# Patient Record
Sex: Male | Born: 1960 | Race: White | Hispanic: No | Marital: Married | Smoking: Never smoker
Health system: Southern US, Community
[De-identification: ages and names within clinical notes are randomized; demographics above are authoritative.]

## PROBLEM LIST (undated history)

## (undated) HISTORY — PX: CHOLECYSTECTOMY: SHX55

## (undated) HISTORY — PX: ABDOMINAL SURGERY: SHX537

---

## 1998-01-17 ENCOUNTER — Inpatient Hospital Stay (HOSPITAL_COMMUNITY): Admission: EM | Admit: 1998-01-17 | Discharge: 1998-01-22 | Payer: Self-pay | Admitting: Emergency Medicine

## 2001-05-25 ENCOUNTER — Emergency Department (HOSPITAL_COMMUNITY): Admission: EM | Admit: 2001-05-25 | Discharge: 2001-05-25 | Payer: Self-pay | Admitting: Emergency Medicine

## 2016-03-29 ENCOUNTER — Emergency Department (HOSPITAL_COMMUNITY)
Admission: EM | Admit: 2016-03-29 | Discharge: 2016-03-29 | Disposition: A | Discharge status: Home / Self Care | Payer: Commercial Managed Care - PPO | Attending: Emergency Medicine | Admitting: Emergency Medicine

## 2016-03-29 ENCOUNTER — Encounter (HOSPITAL_COMMUNITY): Payer: Self-pay | Admitting: Emergency Medicine

## 2016-03-29 DIAGNOSIS — IMO0002 Reserved for concepts with insufficient information to code with codable children: Secondary | ICD-10-CM

## 2016-03-29 DIAGNOSIS — Z5181 Encounter for therapeutic drug level monitoring: Secondary | ICD-10-CM | POA: Insufficient documentation

## 2016-03-29 DIAGNOSIS — F10988 Alcohol use, unspecified with other alcohol-induced disorder: Secondary | ICD-10-CM | POA: Insufficient documentation

## 2016-03-29 LAB — CBC
HEMATOCRIT: 42.8 % (ref 39.0–52.0)
HEMOGLOBIN: 15 g/dL (ref 13.0–17.0)
MCH: 32.8 pg (ref 26.0–34.0)
MCHC: 35 g/dL (ref 30.0–36.0)
MCV: 93.4 fL (ref 78.0–100.0)
PLATELETS: 106 10*3/uL — AB (ref 150–400)
RBC: 4.58 MIL/uL (ref 4.22–5.81)
RDW: 11.4 % — ABNORMAL LOW (ref 11.5–15.5)
WBC: 3.9 10*3/uL — AB (ref 4.0–10.5)

## 2016-03-29 LAB — COMPREHENSIVE METABOLIC PANEL
ALBUMIN: 4.2 g/dL (ref 3.5–5.0)
ALT: 43 U/L (ref 17–63)
ANION GAP: 6 (ref 5–15)
AST: 62 U/L — ABNORMAL HIGH (ref 15–41)
Alkaline Phosphatase: 74 U/L (ref 38–126)
BUN: <5 mg/dL — ABNORMAL LOW (ref 6–20)
CHLORIDE: 104 mmol/L (ref 101–111)
CO2: 25 mmol/L (ref 22–32)
Calcium: 9.3 mg/dL (ref 8.9–10.3)
Creatinine, Ser: 0.78 mg/dL (ref 0.61–1.24)
GFR calc non Af Amer: >60 mL/min (ref >60)
GLUCOSE: 109 mg/dL — AB (ref 65–99)
POTASSIUM: 3.4 mmol/L — AB (ref 3.5–5.1)
SODIUM: 135 mmol/L (ref 135–145)
Total Bilirubin: 2.4 mg/dL — ABNORMAL HIGH (ref 0.3–1.2)
Total Protein: 7.3 g/dL (ref 6.5–8.1)

## 2016-03-29 LAB — RAPID URINE DRUG SCREEN, HOSP PERFORMED
AMPHETAMINES: NOT DETECTED
BARBITURATES: NOT DETECTED
BENZODIAZEPINES: NOT DETECTED
Cocaine: NOT DETECTED
Opiates: NOT DETECTED
TETRAHYDROCANNABINOL: NOT DETECTED

## 2016-03-29 LAB — ETHANOL: Alcohol, Ethyl (B): <5 mg/dL (ref <5)

## 2016-03-29 NOTE — ED Provider Notes (Signed)
[facility]-EMERGENCY DEPT Provider Note   CSN: [id number] Arrival date & time: 03/29/16  1859  First Provider Contact:  First MD Initiated Contact with Patient 03/29/16 2111        History   Chief Complaint Chief Complaint  Patient presents with  . Alcohol Problem    HPI Jeff Dixon is a 55 y.o. male.  55 year old male with a history of alcohol use disorder presents to the ED for alcohol detox. He was sent here after presenting to [facility], per wife. Patient reports drinking 1 pint of liquor daily. Last drink was last night. He denies any tremors currently. No hx of seizures from ETOH withdrawal. He states that he has been drinking heavily "forever". He denies SI/HI. Family with patient, at bedside, stating that they brought him here because they didn't know how to proceed with the process of his sobriety.       History reviewed. No pertinent past medical history.  There are no active problems to display for this patient.   Past Surgical History:  Procedure Laterality Date  . ABDOMINAL SURGERY    . CHOLECYSTECTOMY         Home Medications    Prior to Admission medications   Not on File    Family History No family history on file.  Social History Social History  Substance Use Topics  . Smoking status: Never Smoker  . Smokeless tobacco: Not on file  . Alcohol use Yes     Allergies   Review of patient's allergies indicates no known allergies.   Review of Systems Review of Systems Ten systems reviewed and are negative for acute change, except as noted in the HPI.    Physical Exam Updated Vital Signs BP 118/86 (BP Location: Right Arm)   Pulse (!) 56   Temp 99.2 F (37.3 C) (Oral)   Resp 16   Ht  (1.753 m)   Wt 79.4 kg   SpO2 98%   BMI 25.84 kg/m   Physical Exam  Constitutional: He is oriented to person, place, and time. He appears well-developed and well-nourished. No distress.  Patient in NAD. Calm and cooperative.  HENT:  Head:  Normocephalic and atraumatic.  Eyes: Conjunctivae and EOM are normal. No scleral icterus.  Neck: Normal range of motion.  Cardiovascular: Normal rate and regular rhythm.   Pulmonary/Chest: Effort normal. No respiratory distress.  Respirations even and unlabored  Musculoskeletal: Normal range of motion.  Neurological: He is alert and oriented to person, place, and time.  GCS 15. Speech is goal oriented.  Skin: Skin is warm and dry. No rash noted. He is not diaphoretic. No erythema. No pallor.  Psychiatric: He has a normal mood and affect. His behavior is normal.  Nursing note and vitals reviewed.    ED Treatments / Results  Labs (all labs ordered are listed, but only abnormal results are displayed) Labs Reviewed  COMPREHENSIVE METABOLIC PANEL - Abnormal; Notable for the following:       Result Value   Potassium 3.4 (*)    Glucose, Bld 109 (*)    BUN <5 (*)    AST 62 (*)    Total Bilirubin 2.4 (*)    All other components within normal limits  CBC - Abnormal; Notable for the following:    WBC 3.9 (*)    RDW 11.4 (*)    Platelets 106 (*)    All other components within normal limits  ETHANOL  URINE RAPID DRUG SCREEN, HOSP PERFORMED  EKG  EKG Interpretation None       Radiology No results found.  Procedures Procedures (including critical care time)  Medications Ordered in ED Medications - No data to display   Initial Impression / Assessment and Plan / ED Course  I have reviewed the triage vital signs and the nursing notes.  Pertinent labs & imaging results that were available during my care of the patient were reviewed by me and considered in my medical decision making (see chart for details).  Clinical Course    55 year old male presents to the emergency department requesting a call detox. His last drink was last night and his alcohol level is undetectable. Patient was stable vital signs; no tachycardia or hypertension. No history of seizures from alcohol  withdrawal. It appears that patient has been able to withdraw from alcohol without any significant complications prior to his presentation this evening.  No c/o SI/HI. No indication for TTS consult. I have had a long discussion with the patient and his family at bedside regarding outpatient residential or daily programs for alcohol rehabilitation. Will provide resource guide for this. Return precautions discussed and provided. Patient and family agreeable to plan with no unaddressed concerns.   Final Clinical Impressions(s) / ED Diagnoses   Final diagnoses:  Alcohol use disorder [facility])    New Prescriptions New Prescriptions   No medications on file     Antony Madura, PA-C 03/29/16 2148    Arby Barrette, MD 03/30/16 2308

## 2016-03-29 NOTE — ED Triage Notes (Signed)
Patient requesting detox for his alcoholism , last drink last night , denies  suicidal ideation , no hallucinations .

## 2016-03-29 NOTE — ED Notes (Signed)
EDP at bedside  

## 2017-04-29 ENCOUNTER — Inpatient Hospital Stay (HOSPITAL_COMMUNITY)
Admission: AD | Admit: 2017-04-29 | Discharge: 2017-05-05 | DRG: 885 | Disposition: A | Discharge status: Home / Self Care | Payer: Federal, State, Local not specified - Other | Source: Intra-hospital | Attending: Psychiatry | Admitting: Psychiatry

## 2017-04-29 ENCOUNTER — Encounter (HOSPITAL_COMMUNITY): Payer: Self-pay

## 2017-04-29 DIAGNOSIS — F323 Major depressive disorder, single episode, severe with psychotic features: Secondary | ICD-10-CM | POA: Diagnosis present

## 2017-04-29 DIAGNOSIS — X749XXA Intentional self-harm by unspecified firearm discharge, initial encounter: Secondary | ICD-10-CM | POA: Diagnosis not present

## 2017-04-29 DIAGNOSIS — R45851 Suicidal ideations: Secondary | ICD-10-CM | POA: Diagnosis present

## 2017-04-29 DIAGNOSIS — Z833 Family history of diabetes mellitus: Secondary | ICD-10-CM

## 2017-04-29 DIAGNOSIS — Z634 Disappearance and death of family member: Secondary | ICD-10-CM | POA: Diagnosis not present

## 2017-04-29 DIAGNOSIS — F1010 Alcohol abuse, uncomplicated: Secondary | ICD-10-CM | POA: Diagnosis present

## 2017-04-29 DIAGNOSIS — Z9049 Acquired absence of other specified parts of digestive tract: Secondary | ICD-10-CM | POA: Diagnosis not present

## 2017-04-29 DIAGNOSIS — G4700 Insomnia, unspecified: Secondary | ICD-10-CM | POA: Diagnosis not present

## 2017-04-29 DIAGNOSIS — F1910 Other psychoactive substance abuse, uncomplicated: Secondary | ICD-10-CM | POA: Diagnosis not present

## 2017-04-29 DIAGNOSIS — T1491XA Suicide attempt, initial encounter: Secondary | ICD-10-CM | POA: Diagnosis not present

## 2017-04-29 MED ORDER — HYDROXYZINE HCL 25 MG PO TABS
25.0000 mg | ORAL_TABLET | Freq: Three times a day (TID) | ORAL | Status: DC | PRN
  Filled 2017-04-29 (×5): qty 10

## 2017-04-29 MED ORDER — ALUM & MAG HYDROXIDE-SIMETH 200-200-20 MG/5ML PO SUSP: 30.0000 mL | ORAL | Status: DC | PRN

## 2017-04-29 MED ORDER — ACETAMINOPHEN 325 MG PO TABS
650.0000 mg | ORAL_TABLET | Freq: Four times a day (QID) | ORAL | Status: DC | PRN
Start: 2017-04-29 — End: 2017-05-05

## 2017-04-29 MED ORDER — TRAZODONE HCL 50 MG PO TABS
50.0000 mg | ORAL_TABLET | Freq: Every evening | ORAL | Status: DC | PRN
  Filled 2017-04-29: qty 1

## 2017-04-29 MED ORDER — MAGNESIUM HYDROXIDE 400 MG/5ML PO SUSP: 30.0000 mL | Freq: Every day | ORAL | Status: DC | PRN

## 2017-04-29 NOTE — Progress Notes (Signed)
Writer spoke with patient shortly after report. He and his wife were at the nursing station talking with his nurse. His wife was very concerned about her husband being ok and became tearful asking to be called if anything came about. Writer spoke with charge nurse about patient not being appropriate for the hall and his wife's concern. Patient was moved to 400 hall until bed on 300 becomes available. Patient was oriented to the hall and informed of AA meeting taking place which he attended. He was informed of medications available but declined. Support given and safety maintained on unit with 15 min checks.
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
## 2017-04-29 NOTE — Tx Team (Signed)
Initial Treatment Plan 04/29/2017 8:08 PM Jeff Dixon Jeff Dixon LPF:[id number]    PATIENT STRESSORS: Substance abuse   PATIENT STRENGTHS: Advertising account executive for treatment/growth Physical Health Supportive family/friends   PATIENT IDENTIFIED PROBLEMS: Depression  Suicidal ideation  Substance abuse  "I need to work on my alcohol abuse"               DISCHARGE CRITERIA:  Improved stabilization in mood, thinking, and/or behavior Verbal commitment to aftercare and medication compliance Withdrawal symptoms are absent or subacute and managed without 24-hour nursing intervention  PRELIMINARY DISCHARGE PLAN: Outpatient therapy Medication management  PATIENT/FAMILY INVOLVEMENT: This treatment plan has been presented to and reviewed with the patient, Jeff Dixon.  The patient and family have been given the opportunity to ask questions and make suggestions.  Levin Bacon, RN 04/29/2017, 8:08 PM

## 2017-04-30 ENCOUNTER — Encounter (HOSPITAL_COMMUNITY): Payer: Self-pay | Admitting: Psychiatry

## 2017-04-30 DIAGNOSIS — G4700 Insomnia, unspecified: Secondary | ICD-10-CM

## 2017-04-30 DIAGNOSIS — Z634 Disappearance and death of family member: Secondary | ICD-10-CM

## 2017-04-30 DIAGNOSIS — R45851 Suicidal ideations: Secondary | ICD-10-CM

## 2017-04-30 DIAGNOSIS — F1910 Other psychoactive substance abuse, uncomplicated: Secondary | ICD-10-CM

## 2017-04-30 DIAGNOSIS — F323 Major depressive disorder, single episode, severe with psychotic features: Principal | ICD-10-CM

## 2017-04-30 MED ORDER — LORAZEPAM 1 MG PO TABS: 1.0000 mg | ORAL_TABLET | Freq: Four times a day (QID) | ORAL | Status: AC | PRN

## 2017-04-30 MED ORDER — NALTREXONE HCL 50 MG PO TABS
25.0000 mg | ORAL_TABLET | Freq: Every day | ORAL | Status: DC
  Filled 2017-04-30 (×2): qty 1

## 2017-04-30 MED ORDER — LOPERAMIDE HCL 2 MG PO CAPS: 2.0000 mg | ORAL_CAPSULE | ORAL | Status: AC | PRN

## 2017-04-30 MED ORDER — ONDANSETRON 4 MG PO TBDP: 4.0000 mg | ORAL_TABLET | Freq: Four times a day (QID) | ORAL | Status: AC | PRN

## 2017-04-30 MED ORDER — VITAMIN B-1 100 MG PO TABS
100.0000 mg | ORAL_TABLET | Freq: Every day | ORAL | Status: DC
  Administered 2017-05-01 – 2017-05-05 (×5): 100 mg via ORAL
  Filled 2017-04-30 (×7): qty 1

## 2017-04-30 MED ORDER — ESCITALOPRAM OXALATE 5 MG PO TABS
5.0000 mg | ORAL_TABLET | Freq: Every day | ORAL | Status: DC
  Filled 2017-04-30 (×2): qty 1

## 2017-04-30 MED ORDER — THIAMINE HCL 100 MG/ML IJ SOLN: 100.0000 mg | Freq: Once | INTRAMUSCULAR | Status: DC

## 2017-04-30 MED ORDER — ADULT MULTIVITAMIN W/MINERALS CH
1.0000 | ORAL_TABLET | Freq: Every day | ORAL | Status: DC
  Administered 2017-04-30 – 2017-05-05 (×6): 1 via ORAL
  Filled 2017-04-30 (×9): qty 1

## 2017-04-30 NOTE — Progress Notes (Signed)
Patient attended the evening group session and responded to all discussion prompts from this Clinical research associate. Patient shared his goal for the day was to discharge. Patients affect was appropriate and rated his day an 8 out of 10.
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
## 2017-04-30 NOTE — Progress Notes (Signed)
Nursing Progress Note [id number]  D) Patient presents with flat affect and is cautious/minimal with Clinical research associate. Patient reports, "I am trying to remember my wife's phone number. I lost my paper". Writer informed patient Clinical research associate could check for wife's number in his chart. Patient denied need and states, "I think I know it". Patient forwards little with Clinical research associate and appears preoccupied. Patient denied concerns for writer and denied need for sleep medication. Patient denies SI/HI/AVH or pain and contracts for safety on the unit. Patient does not appear to be in withdrawal at this time, CIWA score is 0.  A) Emotional support given. 1:1 interaction and active listening provided. No medications requested/scheduled this shift. Snacks and fluids provided. Opportunities for questions or concerns presented to patient. Patient encouraged to continue to work on treatment goals. Labs, vital signs and patient behavior monitored throughout shift. Patient safety maintained with q15 min safety checks. Low fall risk precautions in place and reviewed with patient; patient verbalized understanding.  R) Patient receptive to interaction with nurse. Patient remains safe on the unit at this time. Patient is resting in bed without complaints. Will continue to monitor.

## 2017-04-30 NOTE — [facility] Group Notes (Signed)
[facility] Group Notes:  (Nursing/MHT/Case Management/Adjunct)  Date:  04/30/2017  Time:  1:16 PM  Type of Therapy:  Nurse Education  Participation Level:  Active  Participation Quality:  Attentive  Affect:  Appropriate  Cognitive:  Alert  Insight:  Good  Engagement in Group:  Engaged   Modes of Intervention:  Education  Summary of Progress/Problems:  Jeff Dixon 04/30/2017, 1:16 PM

## 2017-04-30 NOTE — Plan of Care (Signed)
Problem: Safety: Goal: Periods of time without injury will increase Outcome: Progressing Patient is on q15 minute safety checks and low fall risk precautions. Patient contracts for safety on the unit and remains safe at this time.   

## 2017-04-30 NOTE — [facility] Group Notes (Deleted)
[facility] LCSW Group Therapy Note  Date/Time 04/30/17 10:00AM  Type of Therapy and Topic:  Group Therapy:  Cognitive Distortions  Participation Level:  Did Not Attend   Description of Group:    Patients in this group will be introduced to the topic of cognitive distortions.  Patients will identify and describe cognitive distortions, describe the feelings these distortions create for them.  Patients will identify one or more situations in their personal life where they have cognitively distorted thinking and will verbalize challenging this cognitive distortion through positive thinking skills.  Patients will practice the skill of using positive affirmations to challenge cognitive distortions.    Yared Barefoot J Nelsie Domino MSW, LCSW  

## 2017-04-30 NOTE — [facility] Group Notes (Addendum)
Healthy Support Systems   Date:  04/30/2017  Time:  1100  Type of Therapy:  Nurse Education   /  Healthy Support Systems: The group is focused on teaching patients how to develop and maintain healthy support systems.  Participation Quality:  Attentive  Affect:  Flat  Cognitive:  Oriented  Insight:  Limited  Engagement in Group:  Improving  Modes of Intervention:  Education  Summary of Progress/Problems:  Rich Brave 04/30/2017, 1:18 PM

## 2017-04-30 NOTE — [facility] Group Notes (Signed)
[facility] LCSW Group Therapy Note   04/30/2017  10:15 - 11:15 AM   Type of Therapy and Topic: Group Therapy: Feelings Around Returning Home & Establishing a Framework Self Care   Participation Level:  Minimal   Description of Group:  Patients first processed thoughts and feelings about up coming discharge. These included fears of upcoming changes, lack of change, new living environments, judgements and expectations from others and overall stigma of MH issues. We discussed where to find supports and what self care may look and feel like.  Therapeutic Goals Addressed in Processing Group:  1. Patient will identify one healthy supportive that they can use at discharge. 2. Patient will identify one factor of a supportive framework and how to tell it from an unhealthy network. 3. Patient able to identify one coping skill to use when they do not have positive supports from others. 4. Patient will demonstrate ability to communicate their needs through discussion and/or role plays.  Summary of Patient Progress:  Pt engaged minimally during group session. As patients processed their anxiety about discharge and described healthy supports patient was quietly observant. As this is patient's first group session it is expected he will engage more as stay progresses.   Carney Bern, LCSW

## 2017-04-30 NOTE — Progress Notes (Signed)
Data. Patient denies SI/HI/AVH. Verbally contracts for safety on the unit and to come to staff before acting of any self harm thoughts/feelings.  Patient interacting minimally with staff and other patients. Patient's affect is flat/blunt and does not brighten with interaction. Action. Emotional support and encouragement offered. Education provided on medication, indications and side effect. Q 15 minute checks done for safety. Response. Safety on the unit maintained through 15 minute checks.  Medications taken as prescribed. Attended groups. Remained calm and appropriate through out shift.

## 2017-05-01 DIAGNOSIS — F419 Anxiety disorder, unspecified: Secondary | ICD-10-CM

## 2017-05-01 DIAGNOSIS — T1491XA Suicide attempt, initial encounter: Secondary | ICD-10-CM

## 2017-05-01 DIAGNOSIS — X749XXA Intentional self-harm by unspecified firearm discharge, initial encounter: Secondary | ICD-10-CM

## 2017-05-01 LAB — TSH: TSH: 9.614 u[IU]/mL — ABNORMAL HIGH (ref 0.350–4.500)

## 2017-05-01 LAB — BASIC METABOLIC PANEL
ANION GAP: 7 (ref 5–15)
BUN: 11 mg/dL (ref 6–20)
CHLORIDE: 104 mmol/L (ref 101–111)
CO2: 28 mmol/L (ref 22–32)
Calcium: 10.1 mg/dL (ref 8.9–10.3)
Creatinine, Ser: 0.8 mg/dL (ref 0.61–1.24)
GFR calc Af Amer: >60 mL/min (ref >60)
Glucose, Bld: 85 mg/dL (ref 65–99)
POTASSIUM: 3.9 mmol/L (ref 3.5–5.1)
SODIUM: 139 mmol/L (ref 135–145)

## 2017-05-01 LAB — MAGNESIUM: MAGNESIUM: 1.6 mg/dL — AB (ref 1.7–2.4)

## 2017-05-01 LAB — VITAMIN B12: VITAMIN B 12: 688 pg/mL (ref 180–914)

## 2017-05-01 NOTE — Tx Team (Signed)
Interdisciplinary Treatment and Diagnostic Plan Update  05/01/2017 Time of Session: 9:30am Jeff Dixon MRN: [id number]  Principal Diagnosis: MDD (major depressive disorder), single episode, severe with psychosis ([place])  Secondary Diagnoses: Principal Problem:   MDD (major depressive disorder), single episode, severe with psychosis ([place])   Current Medications:  Current Facility-Administered Medications  Medication Dose Route Frequency Provider Last Rate Last Dose  . acetaminophen (TYLENOL) tablet 650 mg  650 mg Oral Q6H PRN Okonkwo, Justina A, NP      . alum & mag hydroxide-simeth (MAALOX/MYLANTA) 200-200-20 MG/5ML suspension 30 mL  30 mL Oral Q4H PRN Okonkwo, Justina A, NP      . hydrOXYzine (ATARAX/VISTARIL) tablet 25 mg  25 mg Oral TID PRN Okonkwo, Justina A, NP      . loperamide (IMODIUM) capsule 2-4 mg  2-4 mg Oral PRN Derrill Center, NP      . LORazepam (ATIVAN) tablet 1 mg  1 mg Oral Q6H PRN Derrill Center, NP      . magnesium hydroxide (MILK OF MAGNESIA) suspension 30 mL  30 mL Oral Daily PRN Okonkwo, Justina A, NP      . multivitamin with minerals tablet 1 tablet  1 tablet Oral Daily Derrill Center, NP   1 tablet at 05/01/17 [phone]  . ondansetron (ZOFRAN-ODT) disintegrating tablet 4 mg  4 mg Oral Q6H PRN Derrill Center, NP      . thiamine (B-1) injection 100 mg  100 mg Intramuscular Once Derrill Center, NP      . thiamine (VITAMIN B-1) tablet 100 mg  100 mg Oral Daily Derrill Center, NP   100 mg at 05/01/17 0918  . traZODone (DESYREL) tablet 50 mg  50 mg Oral QHS PRN Okonkwo, Justina A, NP        PTA Medications: No prescriptions prior to admission.    Treatment Modalities: Medication Management, Group therapy, Case management,  1 to 1 session with clinician, Psychoeducation, Recreational therapy.  Patient Stressors: Substance abuse  Patient Strengths: Dispensing optician for treatment/growth Physical Health Supportive  family/friends  Physician Treatment Plan for Primary Diagnosis: MDD (major depressive disorder), single episode, severe with psychosis ([place]) Long Term Goal(s): Improvement in symptoms so as ready for discharge  Short Term Goals: Ability to identify changes in lifestyle to reduce recurrence of condition will improve Ability to verbalize feelings will improve Ability to demonstrate self-control will improve Ability to demonstrate self-control will improve Compliance with prescribed medications will improve Ability to identify triggers associated with substance abuse/mental health issues will improve  Medication Management: Evaluate patient's response, side effects, and tolerance of medication regimen.  Therapeutic Interventions: 1 to 1 sessions, Unit Group sessions and Medication administration.  Evaluation of Outcomes: Not Met  Physician Treatment Plan for Secondary Diagnosis: Principal Problem:   MDD (major depressive disorder), single episode, severe with psychosis ([place])   Long Term Goal(s): Improvement in symptoms so as ready for discharge  Short Term Goals: Ability to identify changes in lifestyle to reduce recurrence of condition will improve Ability to verbalize feelings will improve Ability to demonstrate self-control will improve Ability to demonstrate self-control will improve Compliance with prescribed medications will improve Ability to identify triggers associated with substance abuse/mental health issues will improve  Medication Management: Evaluate patient's response, side effects, and tolerance of medication regimen.  Therapeutic Interventions: 1 to 1 sessions, Unit Group sessions and Medication administration.  Evaluation of Outcomes: Not Met   RN Treatment Plan for Primary Diagnosis:  MDD (major depressive disorder), single episode, severe with psychosis ([place]) Long Term Goal(s): Knowledge of disease and therapeutic regimen to maintain health will improve  Short  Term Goals: Ability to participate in decision making will improve, Ability to identify and develop effective coping behaviors will improve and Compliance with prescribed medications will improve  Medication Management: RN will administer medications as ordered by provider, will assess and evaluate patient's response and provide education to patient for prescribed medication. RN will report any adverse and/or side effects to prescribing provider.  Therapeutic Interventions: 1 on 1 counseling sessions, Psychoeducation, Medication administration, Evaluate responses to treatment, Monitor vital signs and CBGs as ordered, Perform/monitor CIWA, COWS, AIMS and Fall Risk screenings as ordered, Perform wound care treatments as ordered.  Evaluation of Outcomes: Not Met   LCSW Treatment Plan for Primary Diagnosis: MDD (major depressive disorder), single episode, severe with psychosis ([place]) Long Term Goal(s): Safe transition to appropriate next level of care at discharge, Engage patient in therapeutic group addressing interpersonal concerns.  Short Term Goals: Engage patient in aftercare planning with referrals and resources, Facilitate acceptance of mental health diagnosis and concerns and Increase skills for wellness and recovery  Therapeutic Interventions: Assess for all discharge needs, 1 to 1 time with Social worker, Explore available resources and support systems, Assess for adequacy in community support network, Educate family and significant other(s) on suicide prevention, Complete Psychosocial Assessment, Interpersonal group therapy.  Evaluation of Outcomes: Not Met   Progress in Treatment: Attending groups: Yes Participating in groups: Minimally  Taking medication as prescribed: Yes, MD continues to assess for medication changes as needed Toleration medication: Yes, no side effects reported at this time Family/Significant other contact made: No, CSW attempting to contact husband Patient  understands diagnosis: Minimal insight Discussing patient identified problems/goals with staff: Yes Medical problems stabilized or resolved: Yes Denies suicidal/homicidal ideation: Yes Issues/concerns per patient self-inventory: None Other: N/A  New problem(s) identified: None identified at this time.   New Short Term/Long Term Goal(s): "I want to go home"  Discharge Plan or Barriers: Pt will return home and follow-up with outpatient services; CSW assessing for appropriate therapy referrals as Pt is currently not taking any psych medications  Reason for Continuation of Hospitalization: Anxiety Medication stabilization Paranoia  Estimated Length of Stay: 3-5 days  Attendees: Patient: Jeff Dixon 05/01/2017  5:02 PM  Physician:  05/01/2017  5:02 PM  Nursing: Tamela Oddi, RN 05/01/2017  5:02 PM  RN Care Manager:  05/01/2017  5:02 PM  Social Worker: Adriana Reams, LCSW 05/01/2017  5:02 PM  Recreational Therapist:  05/01/2017  5:02 PM  Other: 05/01/2017  5:02 PM  Other:  05/01/2017  5:02 PM  Other: 05/01/2017  5:02 PM    Scribe for Treatment Team: Gladstone Lighter, LCSW 05/01/2017 5:02 PM

## 2017-05-01 NOTE — Progress Notes (Signed)
Patient ID: Jeff Dixon, male   DOB: [birth year], 56 y.o.   MRN: [id number] Patient denies SI/HI/AVH, was apprehensive about taking his morning meds, but educated by this RN about his medications, and took them.  Pt reports a good night's sleep, reports a good appetite, reports his concentration level as good, denies pain.   Patient's mood is blunted, he was in his room this morning, preoccupied with "arranging my clothes". Pt repeatedly placed clothes in a paper bag, then on his bed over and over again. Pt placed on his self inventory sheet that what is most important to him today is "dismisal".  Pt ate breakfast and lunch, Q15 minute checks in place, will continue to monitor.

## 2017-05-01 NOTE — Plan of Care (Signed)
Problem: Safety: Goal: Periods of time without injury will increase Outcome: Progressing Patient is on q15 minute safety checks and low fall risk precautions. Patient contracts for safety on the unit and remains safe at this time.   

## 2017-05-01 NOTE — [facility] Group Notes (Signed)
LCSW Group Therapy Note   05/01/2017 1:15pm   Type of Therapy and Topic:  Group Therapy:  Overcoming Obstacles   Participation Level:  Minimal   Description of Group:    In this group patients will be encouraged to explore what they see as obstacles to their own wellness and recovery. They will be guided to discuss their thoughts, feelings, and behaviors related to these obstacles. The group will process together ways to cope with barriers, with attention given to specific choices patients can make. Each patient will be challenged to identify changes they are motivated to make in order to overcome their obstacles. This group will be process-oriented, with patients participating in exploration of their own experiences as well as giving and receiving support and challenge from other group members.   Therapeutic Goals: 1. Patient will identify personal and current obstacles as they relate to admission. 2. Patient will identify barriers that currently interfere with their wellness or overcoming obstacles.  3. Patient will identify feelings, thought process and behaviors related to these barriers. 4. Patient will identify two changes they are willing to make to overcome these obstacles:      Summary of Patient Progress   Pt is focused on his desire to go home and identified discharge as his only obstacle.    Therapeutic Modalities:   Cognitive Behavioral Therapy Solution Focused Therapy Motivational Interviewing Relapse Prevention Therapy  Verdene Lennert, LCSW 05/01/2017 5:01 PM

## 2017-05-01 NOTE — Progress Notes (Signed)
Recreation Therapy Notes  Date: 05/01/17 Time: 0915 Location: 300 Hall Group Room  Group Topic: Stress Management  Goal Area(s) Addresses:  Patient will verbalize importance of using healthy stress management.  Patient will identify positive emotions associated with healthy stress management.   Behavioral Response: Engaged  Intervention: Stress Management  Activity :  Body Scan Meditation.  LRT introduced the stress management technique of meditation.  LRT played a meditation from the Calm app to allow patients to take inventory of any tension, calmness or other sensations they may have been feeling.  Patients were to follow along as the meditation played.  Education:  Stress Management, Discharge Planning.   Education Outcome: Acknowledges edcuation/In group clarification offered/Needs additional education  Clinical Observations/Feedback: Pt attended group.   Caroll Rancher, LRT/CTRS         Caroll Rancher A 05/01/2017 11:46 AM
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
## 2017-05-01 NOTE — Progress Notes (Signed)
Nursing Progress Note [id number]  D) Patient is observed up in the milieu but does not interact much with peers. Patient has a fixed smile and is slow to respond. Patient is forwards little with Clinical research associate and asks, "how are you?" Patient has intense eye contact. Patient denies SI/HI/AVH or pain. Patient contracts for safety on the unit. Patient denies need for sleep medication. Patient educated about PRN orders available; patient has limited understanding, reinforcement needed.  A) Emotional support given. 1:1 interaction and active listening provided. No medications provided/requested this shift. Snacks and fluids provided. Opportunities for questions or concerns presented to patient. Patient encouraged to continue to work on treatment goals. Labs, vital signs and patient behavior monitored throughout shift. Patient safety maintained with q15 min safety checks. Low fall risk precautions in place and reviewed with patient; patient verbalized understanding.  R) Patient receptive to interaction with nurse. Patient remains safe on the unit at this time. Patient is resting in bed without complaints. Will continue to monitor.

## 2017-05-02 LAB — VITAMIN D 25 HYDROXY (VIT D DEFICIENCY, FRACTURES): Vit D, 25-Hydroxy: 45.1 ng/mL (ref 30.0–100.0)

## 2017-05-02 LAB — T4, FREE: FREE T4: 1 ng/dL (ref 0.61–1.12)

## 2017-05-02 MED ORDER — MIRTAZAPINE 7.5 MG PO TABS
7.5000 mg | ORAL_TABLET | Freq: Every day | ORAL | Status: DC
  Filled 2017-05-02 (×3): qty 1

## 2017-05-02 NOTE — Progress Notes (Signed)
[facility] MD Progress Note  05/02/2017 8:54 AM Jeff Dixon  MRN:  [id number] Subjective:  Patient reports he is feeling " all right". Minimizes depression. Denies suicidal ideations. Denies significant alcohol WDL symptoms, and does not appear to be in any acute distress. Objective : I have discussed case with treatment team and have met with patient. Staff reports patient has limited interaction with peers or staff, and appears to have difficulty understanding instructions at times. No disruptive or agitated behaviors .  Today patient provided me with express consent to speak with wife - she reports she did not witness episode, but that patient did tell her he had fired the gun as a suicide attempt but missed. ( He states that he shot at a swingset because he was upset it had toppled over but denies suicidal intent) . She states he has been drinking heavily and that she has been finding alcohol hidden in different places around the house . She expresses concern that his cognitive status appears to have deteriorated over recent weeks to months. A major stressor was losing his job of 30 years in April. He has also lost a significant amount of weight over the last several weeks to months .  Patient denies feeling depressed, states " I am OK". He denies SI. He denies neuro-vegetative symptoms of depression. Today he scored 23/30 on MMSE- immediate and recent memory relatively preserved, scored 3/3 immediate and 2/3 at 3 minutes . He had more difficulty with serial subtractions , spelling WORLD backwards and writing seemed poor for his age and educational level.  No medication side effects.        Principal Problem: MDD (major depressive disorder), single episode, severe with psychosis ([place]) Diagnosis:   Patient Active Problem List   Diagnosis Date Noted  . MDD (major depressive disorder), single episode, severe with psychosis ([place]) [F32.3] 04/29/2017   Total Time spent with patient: 25 minutes    Past Medical History: History reviewed. No pertinent past medical history.  Past Surgical History:  Procedure Laterality Date  . ABDOMINAL SURGERY    . CHOLECYSTECTOMY     Family History:  Family History  Problem Relation Age of Onset  . Diabetes Father   . Mental illness Neg Hx    Social History:  History  Alcohol Use  . Yes     History  Drug Use No    Social History   Social History  . Marital status: Married    Spouse name: N/A  . Number of children: N/A  . Years of education: N/A   Social History Main Topics  . Smoking status: Never Smoker  . Smokeless tobacco: Never Used  . Alcohol use Yes  . Drug use: No  . Sexual activity: Not Asked   Other Topics Concern  . None   Social History Narrative  . None   Additional Social History:    Pain Medications: none Prescriptions: none Over the Counter: none History of alcohol / drug use?: Yes Name of Substance 1: alcohol (Vodka) 1 - Age of First Use: been drinking "all my life" 1 - Amount (size/oz): a pint to a fifth 1 - Frequency: daily 1 - Duration: ongoing 1 - Last Use / Amount: BAL was 290 upon arrival to [place] on 04/27/2017    Sleep: Good  Appetite:  Good  Current Medications: Current Facility-Administered Medications  Medication Dose Route Frequency Provider Last Rate Last Dose  . acetaminophen (TYLENOL) tablet 650 mg  650 mg Oral Q6H  PRN Hughie Closs A, NP      . alum & mag hydroxide-simeth (MAALOX/MYLANTA) 200-200-20 MG/5ML suspension 30 mL  30 mL Oral Q4H PRN Okonkwo, Justina A, NP      . hydrOXYzine (ATARAX/VISTARIL) tablet 25 mg  25 mg Oral TID PRN Okonkwo, Justina A, NP      . loperamide (IMODIUM) capsule 2-4 mg  2-4 mg Oral PRN Derrill Center, NP      . LORazepam (ATIVAN) tablet 1 mg  1 mg Oral Q6H PRN Derrill Center, NP      . magnesium hydroxide (MILK OF MAGNESIA) suspension 30 mL  30 mL Oral Daily PRN Okonkwo, Justina A, NP      . multivitamin with minerals tablet 1 tablet  1  tablet Oral Daily Derrill Center, NP   1 tablet at 05/02/17 0830  . ondansetron (ZOFRAN-ODT) disintegrating tablet 4 mg  4 mg Oral Q6H PRN Derrill Center, NP      . thiamine (B-1) injection 100 mg  100 mg Intramuscular Once Derrill Center, NP      . thiamine (VITAMIN B-1) tablet 100 mg  100 mg Oral Daily Derrill Center, NP   100 mg at 05/02/17 0830  . traZODone (DESYREL) tablet 50 mg  50 mg Oral QHS PRN Lu Duffel, Justina A, NP        Lab Results:  Results for orders placed or performed during the hospital encounter of 04/29/17 (from the past 48 hour(s))  TSH     Status: Abnormal   Collection Time: 05/01/17  6:27 AM  Result Value Ref Range   TSH 9.614 (H) 0.350 - 4.500 uIU/mL    Comment: Performed by a 3rd Generation assay with a functional sensitivity of <=0.01 uIU/mL. Performed at [facility], [place] [address], [place], [place] [address]   Vitamin B12     Status: None   Collection Time: 05/01/17  6:27 AM  Result Value Ref Range   Vitamin B-12 688 180 - 914 pg/mL    Comment: (NOTE) This assay is not validated for testing neonatal or myeloproliferative syndrome specimens for Vitamin B12 levels. Performed at [facility], [place] [address], [place], [place] [address]   VITAMIN D 25 Hydroxy (Vit-D Deficiency, Fractures)     Status: None   Collection Time: 05/01/17  6:27 AM  Result Value Ref Range   Vit D, 25-Hydroxy 45.1 30.0 - 100.0 ng/mL    Comment: (NOTE) Vitamin D deficiency has been defined by the [place] practice guideline as a level of serum 25-OH vitamin D less than 20 ng/mL (1,2). The Endocrine Society went on to further define vitamin D insufficiency as a level between 21 and 29 ng/mL (2). 1. IOM (Institute of Medicine). 2010. Dietary reference   intakes for calcium and D. [place]: The   [employer]. 2. Holick MF, [place], Bischoff-Ferrari HA, et al.   Evaluation, treatment, and  prevention of vitamin D   deficiency: an Endocrine Society clinical practice   guideline. JCEM. 2011 Jul; 96(7):1911-30. Performed At: [facility] [place], [place] [id number] Lindon Romp MD [id number] Performed at [facility], [place] [address], [place], [place] [address]   Basic metabolic panel     Status: None   Collection Time: 05/01/17  6:27 AM  Result Value Ref Range   Sodium 139 135 - 145 mmol/L   Potassium 3.9 3.5 - 5.1  mmol/L   Chloride 104 101 - 111 mmol/L   CO2 28 22 - 32 mmol/L   Glucose, Bld 85 65 - 99 mg/dL   BUN 11 6 - 20 mg/dL   Creatinine, Ser 0.80 0.61 - 1.24 mg/dL   Calcium 10.1 8.9 - 10.3 mg/dL   GFR calc non Af Amer >60 >60 mL/min   GFR calc Af Amer >60 >60 mL/min    Comment: (NOTE) The eGFR has been calculated using the CKD EPI equation. This calculation has not been validated in all clinical situations. eGFR's persistently <60 mL/min signify possible Chronic Kidney Disease.    Anion gap 7 5 - 15    Comment: Performed at [facility], [place] [address], [place], [place] [address]  Magnesium     Status: Abnormal   Collection Time: 05/01/17  6:27 AM  Result Value Ref Range   Magnesium 1.6 (L) 1.7 - 2.4 mg/dL    Comment: Performed at [facility], [place] [address], [place], [place] [address]    Blood Alcohol level:  Lab Results  Component Value Date   ETH <5 46/50/3546    Metabolic Disorder Labs: No results found for: HGBA1C, MPG No results found for: PROLACTIN No results found for: CHOL, TRIG, HDL, CHOLHDL, VLDL, LDLCALC  Physical Findings: AIMS: Facial and Oral Movements Muscles of Facial Expression: None, normal Lips and Perioral Area: None, normal Jaw: None, normal Tongue: None, normal,Extremity Movements Upper (arms, wrists, hands, fingers): None, normal Lower (legs, knees, ankles, toes): None, normal, Trunk Movements Neck, shoulders, hips: None, normal,  Overall Severity Severity of abnormal movements (highest score from questions above): None, normal Incapacitation due to abnormal movements: None, normal Patient's awareness of abnormal movements (rate only patient's report): No Awareness, Dental Status Current problems with teeth and/or dentures?: No Does patient usually wear dentures?: No  CIWA:  CIWA-Ar Total: 0 COWS:     Musculoskeletal: Strength & Muscle Tone: within normal limits Gait & Station: normal Patient leans: N/A  Psychiatric Specialty Exam: Physical Exam  ROS denies headache, denies visual disturbances, denies chest pain or dyspnea, no vomiting   Blood pressure 99/63, pulse 72, temperature 98.5 F (36.9 C), temperature source Oral, resp. rate 16, height 5' 8"  (1.727 m), weight 76.2 kg (168 lb), SpO2 100 %.Body mass index is 25.54 kg/m.  General Appearance: Fairly Groomed  Eye Contact:  improving   Speech:  decreased   Volume:  Decreased  Mood:  denies depression, reports mood is normal  Affect:  Restricted  Thought Process:  Linear and Descriptions of Associations: Intact- slightly slowed   Orientation:  Other:  fully alert and attentive - he is fully oriented x 3 ( as mentioned , scored 2330 on MMSE)   Thought Content:  denies hallucinations, no delusions, not internally preoccupied   Suicidal Thoughts:  No denies any suicidal or self injurious ideations, denies any homicidal or violent ideations, contracts for safety on unit   Homicidal Thoughts:  No  Memory:  recent and remote grossly intact  recall 3/3 immediate and 2/3 at 3 minutes   Judgement:  Other:  fair   Insight:  fair   Psychomotor Activity:  Normal- no current tremors or diaphoresis, no restlessness or agitation  Concentration:  Concentration: Good and Attention Span: Good  Recall:  [employer] of Knowledge:  Fair  Language:  Fair  Akathisia:  Negative  Handed:  Right  AIMS (if indicated):     Assets:  Desire for Improvement Resilience  ADL's:   Intact  Cognition:  WNL  Sleep:  Number of Hours: 6   Assessment - patient presents calm, denies depression, presents with blunted, restricted affect and limited speech. Wife corroborates patient told her he had attempted suicide by shooting self which he has denied . Denies any SI at this time.  He denies feeling depressed, but wife reports he has had weight loss, decreased level of functioning after losing his long term job earlier this year. He endorses history of alcohol dependence- is not presenting with significant withdrawal symptoms at this time.  Wife has reported cognitive decline over recent weeks to months, and patient scored 23/30 on MMSE) .   Treatment Plan Summary: Treatment plan reviewed as below today 8/28 Daily contact with patient to assess and evaluate symptoms and progress in treatment, Medication management, Plan inpatient admission  and medications as below Encourage group and milieu participation to work on coping skills and symptom reduction Continue to encourage efforts to remain sober and work on abstinence  Currently on [place] for potential alcohol withdrawal symptoms Continue Thiamine and MVI D/C Trazodone  Start Remeron 7.5 mgrs QHS for depression, sleep Continue Vistaril 25 mgrs Q 8 hours PRN for anxiety Based on history of cognitive decline  will request Head MRI  FT3, FT4 ordered due to elevated TSH, results pending at this time  *Wife reports she has locked away firearms and that patient will have no access to these after discharge . Jenne Campus, MD 05/02/2017, 8:54 AM   Patient ID: Jeff Dixon, male   DOB: [birth year], 56 y.o.   MRN: [id number]

## 2017-05-02 NOTE — [facility] Group Notes (Signed)
LCSW Group Therapy Note  05/02/2017 1:15pm  Type of Therapy/Topic:  Group Therapy:  Feelings about Diagnosis  Participation Level:  None   Description of Group:   This group will allow patients to explore their thoughts and feelings about diagnoses they have received. Patients will be guided to explore their level of understanding and acceptance of these diagnoses. Facilitator will encourage patients to process their thoughts and feelings about the reactions of others to their diagnosis and will guide patients in identifying ways to discuss their diagnosis with significant others in their lives. This group will be process-oriented, with patients participating in exploration of their own experiences, giving and receiving support, and processing challenge from other group members.   Therapeutic Goals: 1. Patient will demonstrate understanding of diagnosis as evidenced by identifying two or more symptoms of the disorder 2. Patient will be able to express two feelings regarding the diagnosis 3. Patient will demonstrate their ability to communicate their needs through discussion and/or role play  Summary of Patient Progress: Pt did not participate in group discussion but appeared to be attentive.    Therapeutic Modalities:   Cognitive Behavioral Therapy Brief Therapy Feelings Identification    Verdene Lennert, LCSW 05/02/2017 3:04 PM

## 2017-05-02 NOTE — Progress Notes (Signed)
Nursing Note 05/02/2017 [id number]  Data Reports sleeping good without PRN sleep med.  Rates depression 0/10, hopelessness 0/10, and anxiety 0/10. Affect blunted.  Denies HI, SI, AVH.  Patient somewhat bizarre with responses.  This AM asked RN what he was getting for his morning medicine- was told they were vitamins, to which he responded "you aren't trying to trick me are you? I don't want to take anything."  CIWA discontinued due to no withdrawal symptoms.  Action Spoke with patient 1:1, nurse offered support to patient throughout shift.  Continues to be monitored on 15 minute checks for safety.  Response Remains safe on unit, though somewhat bizarre.

## 2017-05-02 NOTE — Progress Notes (Signed)
[facility] Group Notes:  (Nursing/MHT/Case Management/Adjunct)  Date:  05/02/2017  Time:  1030 Type of Therapy:  Nurse Education  Participation Level:  Active  Participation Quality:  Appropriate  Affect:  Depressed  Cognitive:  Alert and Oriented  Insight:  Improving  Engagement in Group:  Developing/Improving  Modes of Intervention:  Activity, Discussion, Education, Socialization and Support  Summary of Progress/Problems:The purpose of this group is to support patients in developing a goal for today and introduce them to the benefits of aromatherapy. Pt's goal for today is to focus on staying sober.  Beatrix Shipper 05/02/2017, 7:10 PM

## 2017-05-02 NOTE — Plan of Care (Signed)
Problem: Activity: Goal: Interest or engagement in activities will improve Outcome: Progressing Patient is observed interacting with peers in the dayroom this evening. Patient was playing cards with peers.

## 2017-05-02 NOTE — Progress Notes (Signed)
Nursing Progress Note 1900-0730  D) Patient is observed up in the milieu interacting with peers. Patient attended group and was seen playing cards in the dayroom with peers. Patient has a fixed smile and is slow to respond. Patient has intense eye contact but is pleasant and cooperative. Patient denies SI/HI/AVH or pain. Patient contracts for safety on the unit. Patient denies need for sleep medication and refuses scheduled medication.  A) Emotional support given. 1:1 interaction and active listening provided. Snacks and fluids provided. Opportunities for questions or concerns presented to patient. Patient encouraged to continue to work on treatment goals. Labs, vital signs and patient behavior monitored throughout shift. Patient safety maintained with q15 min safety checks. Low fall risk precautions in place and reviewed with patient; patient verbalized understanding.  R) Patient receptive to interaction with nurse. Patient remains safe on the unit at this time. Patient denies any adverse medication reactions at this time. Patient is resting in bed without complaints. Will continue to monitor.

## 2017-05-02 NOTE — [facility] Suicide Risk Assessment (Signed)
[facility] INPATIENT:  Family/Significant Other Suicide Prevention Education  Suicide Prevention Education:  Education Completed; Jeff Dixon, Pt's wife [phone],  (name of family member/significant other) has been identified by the patient as the family member/significant other with whom the patient will be residing, and identified as the person(s) who will aid the patient in the event of a mental health crisis (suicidal ideations/suicide attempt).  With written consent from the patient, the family member/significant other has been provided the following suicide prevention education, prior to the and/or following the discharge of the patient.  The suicide prevention education provided includes the following:  Suicide risk factors  Suicide prevention and interventions  National Suicide Hotline telephone number  [facility] assessment telephone number  [facility] Emergency Assistance 911  [facility] and/or Residential Mobile Crisis Unit telephone number  Request made of family/significant other to:  Remove weapons (e.g., guns, rifles, knives), all items previously/currently identified as safety concern.    Remove drugs/medications (over-the-counter, prescriptions, illicit drugs), all items previously/currently identified as a safety concern.  The family member/significant other verbalizes understanding of the suicide prevention education information provided.  The family member/significant other agrees to remove the items of safety concern listed above.  Pt's wife expresses that she is very concerned for patient, especially due to his cognintive decline. Pt has worked as an Acupuncturist for 69yrs and this state is completely opposite from his normal state. Pt's wife reports that Pt has drank heavily for years, even to the point of passing out in fields near their house.   Jeff Dixon 05/02/2017, 4:38 PM

## 2017-05-03 ENCOUNTER — Inpatient Hospital Stay (HOSPITAL_COMMUNITY): Payer: Federal, State, Local not specified - Other

## 2017-05-03 LAB — T3, FREE: T3, Free: 3.2 pg/mL (ref 2.0–4.4)

## 2017-05-03 MED ORDER — MIRTAZAPINE 15 MG PO TABS
15.0000 mg | ORAL_TABLET | Freq: Every day | ORAL | Status: DC
  Administered 2017-05-04: 15 mg via ORAL
  Filled 2017-05-03: qty 7
  Filled 2017-05-03 (×2): qty 1
  Filled 2017-05-03 (×2): qty 7
  Filled 2017-05-03: qty 1

## 2017-05-03 NOTE — Progress Notes (Signed)
Nursing Progress Note 1900-0730  D) Patient continues to present with fixed smile and slow responses but is pleasant and cooperative with Clinical research associatewriter. Patient denies SI/HI/AVH or pain and contracts for safety on the unit. Patient refused need for sleep medication this evening.  A) Emotional support given. 1:1 interaction and active listening provided. Snacks and fluids provided. Opportunities for questions or concerns presented to patient. Patient encouraged to continue to work on treatment goals. Labs, vital signs and patient behavior monitored throughout shift. Patient safety maintained with q15 min safety checks. Low fall risk precautions in place and reviewed with patient; patient verbalized understanding.  R) Patient receptive to interaction with nurse. Patient remains safe on the unit at this time. Patient is resting in bed without complaints. Will continue to monitor.

## 2017-05-03 NOTE — Progress Notes (Signed)
[facility] Group Notes:  (Nursing/MHT/Case Management/Adjunct)  Date:  05/03/2017  Time:  2100  Type of Therapy:  wrap up group  Participation Level:  Active  Participation Quality:  Appropriate, Attentive, Sharing and Supportive  Affect:  Appropriate  Cognitive:  Appropriate  Insight:  Improving  Engagement in Group:  Engaged  Modes of Intervention:  Clarification, Education and Support  Summary of Progress/Problems: Pt shared that he had a MRI done today because his wife has noticed changed behavior in him. Pt reported having a wonderful visit with his wife , children, and grandkids. Pt shared that if he could change any one thing in his life it would be that he would have come gotten help a long time ago. Pt states that he is grateful he is alive.   Marcille BuffyMcNeil, Keonta Monceaux S 05/03/2017, 10:22 PM

## 2017-05-03 NOTE — [facility] Group Notes (Signed)
[facility] Mental Health Association Group Therapy 05/03/2017 1:15pm  Type of Therapy: Mental Health Association Presentation  Participation Level: Active  Participation Quality: Attentive  Affect: Appropriate  Cognitive: Oriented  Insight: Developing/Improving  Engagement in Therapy: Engaged  Modes of Intervention: Discussion, Education and Socialization  Summary of Progress/Problems: Mental Health Association (MHA) Speaker came to talk about his personal journey with mental health. The pt processed ways by which to relate to the speaker. MHA speaker provided handouts and educational information pertaining to groups and services offered by the [facility]. Pt was engaged in speaker's presentation and was receptive to resources provided.    Vernie Shanks, LCSW 05/03/2017 3:19 PM

## 2017-05-03 NOTE — Progress Notes (Signed)
D: Patient is sleeping and eating well; he states his energy is high and concentration is good.  Patient denies any significant withdrawal from alcohol.  Patient completed his MRI this afternoon and states, "I fell asleep inside the machine."  Patient is focused on discharge.  He presents with fixed smile; he is pleasant. He  Patient has attended one group today and appeared engaged. Patient is observed in day room, however, he does not interact with his peers.  Patient has been minimal with staff. A: Continue to monitor medication management and MD orders.  Safety checks completed every 15 minutes per protocol.  Encouragement and support given as needed. R: Patient remains aloof; does not interact much with staff and peers.
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
## 2017-05-03 NOTE — Progress Notes (Signed)
Recreation Therapy Notes  Date:  05/03/17 Time: 0930 Location: [address] Dayroom  Group Topic: Stress Management  Goal Area(s) Addresses:  Patient will verbalize importance of using healthy stress management.  Patient will identify positive emotions associated with healthy stress management.   Intervention: Stress Management  Activity :  Guided Imagery.  LRT introduced the stress management technique of guided imagery.  LRT read a script so patients could follow along and imagine being in their peaceful place.  Patients were to follow along as LRT read the script to engage in the activity.  Education: Stress Management, Discharge Planning.   Education Outcome: Acknowledges edcuation/In group clarification offered/Needs additional education  Clinical Observations/Feedback:  Pt did not attend group.    Damel Querry, LRT/CTRS        Geramy Lamorte A 05/03/2017 12:27 PM 

## 2017-05-03 NOTE — Plan of Care (Signed)
Problem: Activity: Goal: Sleeping patterns will improve Outcome: Progressing Patient reports he is sleeping well without medications. Patient is recorded to be sleeping around 6 hours at night.

## 2017-05-04 LAB — RPR: RPR Ser Ql: NONREACTIVE

## 2017-05-04 NOTE — [facility] Group Notes (Signed)
Pt attended spiritual care group on grief and loss facilitated by chaplain Vaudie Engebretsen   Group opened with brief discussion and psycho-social ed around grief and loss in relationships and in relation to self - identifying life patterns, circumstances, changes that cause losses. Established group norm of speaking from own life experience. Group goal of establishing open and affirming space for members to share loss and experience with grief, normalize grief experience and provide psycho social education and grief support.     

## 2017-05-04 NOTE — Progress Notes (Signed)
[facility] MD Progress Note  05/04/2017 9:15 AM KEMOND AMORIN  MRN:  [id number] Subjective: patient reports he is doing well, denies feeling depressed today, and denies any suicidal ideations. Denies medication side effects.    Objective : I have discussed case with treatment team and have met with patient. Patient presents partially improved compared to admission- in particular , affect appears more reactive, and he appears better related and more interactive . He reports he realizes he has an " alcohol problem" and agrees that alcohol consumption has  been a contributing factor to this admission and may be contributing to his cognitive difficulties . Today scored 24/30 on MMSE- remains oriented x 3 and sensorium is clear . Labs reviewed- RPR reported as non reactive  Head MRI reported as follows -1. No acute intracranial abnormality. 2. Mild diffuse brain parenchymal volume loss. No disruptive or agitated behaviors on unit, more visible on unit, pleasant on approach. No withdrawal symptoms . Vitals stable. With his express consent I spoke with his wife on phone, who corroborates patient appears improved. She reiterates that there have been noticeable cognitive symptoms over recent months. She states she has removed all alcohol from the home and locked away firearm.  We discussed disposition options- patient is wanting to discharge soon, and wife is agreeing with him returning home., I recommended ongoing follow up with PCP and a Neurologist to monitor and manage cognitive symptoms.        Principal Problem: MDD (major depressive disorder), single episode, severe with psychosis ([place]) Diagnosis:   Patient Active Problem List   Diagnosis Date Noted  . MDD (major depressive disorder), single episode, severe with psychosis ([place]) [F32.3] 04/29/2017   Total Time spent with patient: 20 minutes  Past Medical History: History reviewed. No pertinent past medical history.  Past Surgical History:   Procedure Laterality Date  . ABDOMINAL SURGERY    . CHOLECYSTECTOMY     Family History:  Family History  Problem Relation Age of Onset  . Diabetes Father   . Mental illness Neg Hx    Social History:  History  Alcohol Use  . Yes     History  Drug Use No    Social History   Social History  . Marital status: Married    Spouse name: N/A  . Number of children: N/A  . Years of education: N/A   Social History Main Topics  . Smoking status: Never Smoker  . Smokeless tobacco: Never Used  . Alcohol use Yes  . Drug use: No  . Sexual activity: Not Asked   Other Topics Concern  . None   Social History Narrative  . None   Additional Social History:    Pain Medications: none Prescriptions: none Over the Counter: none History of alcohol / drug use?: Yes Name of Substance 1: alcohol (Vodka) 1 - Age of First Use: been drinking "all my life" 1 - Amount (size/oz): a pint to a fifth 1 - Frequency: daily 1 - Duration: ongoing 1 - Last Use / Amount: BAL was 290 upon arrival to [place] on 04/27/2017    Sleep: Good  Appetite:  Good  Current Medications: Current Facility-Administered Medications  Medication Dose Route Frequency Provider Last Rate Last Dose  . acetaminophen (TYLENOL) tablet 650 mg  650 mg Oral Q6H PRN Okonkwo, Justina A, NP      . alum & mag hydroxide-simeth (MAALOX/MYLANTA) 200-200-20 MG/5ML suspension 30 mL  30 mL Oral Q4H PRN Okonkwo, Justina A, NP      .  hydrOXYzine (ATARAX/VISTARIL) tablet 25 mg  25 mg Oral TID PRN Lu Duffel, Justina A, NP      . magnesium hydroxide (MILK OF MAGNESIA) suspension 30 mL  30 mL Oral Daily PRN Okonkwo, Justina A, NP      . mirtazapine (REMERON) tablet 15 mg  15 mg Oral QHS Cobos, Fernando A, MD      . multivitamin with minerals tablet 1 tablet  1 tablet Oral Daily Derrill Center, NP   1 tablet at 05/04/17 [phone]  . thiamine (B-1) injection 100 mg  100 mg Intramuscular Once Derrill Center, NP      . thiamine (VITAMIN B-1)  tablet 100 mg  100 mg Oral Daily Derrill Center, NP   100 mg at 05/04/17 1497    Lab Results:  Results for orders placed or performed during the hospital encounter of 04/29/17 (from the past 48 hour(s))  RPR     Status: None   Collection Time: 05/03/17  6:30 PM  Result Value Ref Range   RPR Ser Ql Non Reactive Non Reactive    Comment: (NOTE) Performed At: [facility] [place], [place] [id number] Lindon Romp MD [id number] Performed at [facility], [place] [address] [place], [place] [address]     Blood Alcohol level:  Lab Results  Component Value Date   ETH <5 94/70/9628    Metabolic Disorder Labs: No results found for: HGBA1C, MPG No results found for: PROLACTIN No results found for: CHOL, TRIG, HDL, CHOLHDL, VLDL, LDLCALC  Physical Findings: AIMS: Facial and Oral Movements Muscles of Facial Expression: None, normal Lips and Perioral Area: None, normal Jaw: None, normal Tongue: None, normal,Extremity Movements Upper (arms, wrists, hands, fingers): None, normal Lower (legs, knees, ankles, toes): None, normal, Trunk Movements Neck, shoulders, hips: None, normal, Overall Severity Severity of abnormal movements (highest score from questions above): None, normal Incapacitation due to abnormal movements: None, normal Patient's awareness of abnormal movements (rate only patient's report): No Awareness, Dental Status Current problems with teeth and/or dentures?: No Does patient usually wear dentures?: No  CIWA:  CIWA-Ar Total: 0 COWS:     Musculoskeletal: Strength & Muscle Tone: within normal limits Gait & Station: normal Patient leans: N/A  Psychiatric Specialty Exam: Physical Exam  ROS no headache, no chest pain, no shortness of breath   Blood pressure (!) 141/84, pulse 95, temperature 98.4 F (36.9 C), temperature source Oral, resp. rate 16, height 5' 8"  (1.727 m), weight 76.2 kg (168 lb), SpO2 100 %.Body mass index is  25.54 kg/m.  General Appearance: improving grooming   Eye Contact:  Good  Speech:  improving   Volume:  Normal  Mood:  reports mood is "OK"  Affect:  more reactive, less blunted   Thought Process:  Linear and Descriptions of Associations: Intact-   Orientation:  Other:  fully alert and attentive - he is fully oriented x 3 scored 24/30 on MMSE on 8/30 )   Thought Content:  denies hallucinations, no delusions, not internally preoccupied   Suicidal Thoughts:  No denies any suicidal or self injurious ideations, denies any homicidal or violent ideations, contracts for safety on unit   Homicidal Thoughts:  No  Memory:  recent and remote grossly intact   Judgement:  Other:  fair - gradually improving   Insight:  fair - gradually improving   Psychomotor Activity:  Normal- no current tremors or diaphoresis, no restlessness or agitation  Concentration:  Concentration: Good and Attention Span:  Good  Recall:  Good  Fund of Knowledge:  Fair  Language:  Good  Akathisia:  Negative  Handed:  Right  AIMS (if indicated):     Assets:  Desire for Improvement Resilience  ADL's:  Intact  Cognition:  WNL  Sleep:  Number of Hours: 5.5   Assessment - patient presents with improving mood and range of affect. Denies depression, denies SI and presents future oriented, stating he is hoping he will eventually be able to return to work. Wife corroborates improvement. Head MRI remarkable for mild and diffuse parenchymal volume loss . He is alert, attentive, oriented x 3- MMSE stable at 24/30 and overall presents more interactive and verbal .    Treatment Plan Summary: Treatment plan reviewed as below today 8/30 Daily contact with patient to assess and evaluate symptoms and progress in treatment, Medication management, Plan inpatient admission  and medications as below Encourage group and milieu participation to work on coping skills and symptom reduction Continue to encourage efforts to remain sober and work  on abstinence  Currently on [place] for potential alcohol withdrawal symptoms Continue Thiamine and MVI Continue  Remeron 15  mgrs QHS for depression, sleep Continue Vistaril 25 mgrs Q 8 hours PRN for anxiety Treatment team working on disposition planning options Patient recommended to follow up with PCP and with Neurologist for ongoing outpatient treatment. Full alcohol abstinence and 12 step program participation discussed and advised . Campral reviewed as an option- he is not interested in adding this medication at present .  Jenne Campus, MD 05/04/2017, 9:15 AM   Patient ID: Modena Jansky, male   DOB: [birth year], 56 y.o.   MRN: [id number]

## 2017-05-04 NOTE — Plan of Care (Signed)
Problem: Education: Goal: Mental status will improve Outcome: Progressing Patient's mental status appears to have improved slightly as evidenced by his ability to exhibit organized thought process.

## 2017-05-04 NOTE — Progress Notes (Signed)
Date: 05/04/17 Time: 0900 Group: Nursing Group  Patient attended group and participated.  He asked appropriate questions and was engaged with his peers.  Patient hopes for discharge tomorrow.  Vesta Mixeraroline B., RN

## 2017-05-04 NOTE — Progress Notes (Signed)
D: Patient is pleasant; he presents with fixed smile.  He denies any depressive symptoms or thoughts of self harm.  He is scheduled for discharge tomorrow and wife will be contacted today per MD.  Patient appears slow to respond at times and is focused on discharge.  He has poor insight regarding his alcohol abuse.  He denies any significant withdrawal symptoms.  He is attending group and appears engaged. A: Continue to monitor medication management and MD orders.  Safety checks completed every 15 minutes per protocol.  Offer support and encouragement as needed. R: Patient is receptive to staff; his behavior is appropriate.

## 2017-05-04 NOTE — [facility] Group Notes (Signed)
LCSW Group Therapy Note  05/04/2017 1:15pm  Type of Therapy/Topic:  Group Therapy:  Balance in Life  Participation Level:  Minimal  Description of Group:    This group will address the concept of balance and how it feels and looks when one is unbalanced. Patients will be encouraged to process areas in their lives that are out of balance and identify reasons for remaining unbalanced. Facilitators will guide patients in utilizing problem-solving interventions to address and correct the stressor making their life unbalanced. Understanding and applying boundaries will be explored and addressed for obtaining and maintaining a balanced life. Patients will be encouraged to explore ways to assertively make their unbalanced needs known to significant others in their lives, using other group members and facilitator for support and feedback.  Therapeutic Goals: 1. Patient will identify two or more emotions or situations they have that consume much of in their lives. 2. Patient will identify signs/triggers that life has become out of balance:  3. Patient will identify two ways to set boundaries in order to achieve balance in their lives:  4. Patient will demonstrate ability to communicate their needs through discussion and/or role plays  Summary of Patient Progress: Pt reports that he feels his life is balanced because of the support system that he has.    Therapeutic Modalities:   Cognitive Behavioral Therapy Solution-Focused Therapy Assertiveness Training  Verdene LennertLauren C Varsha Knock, LCSW 05/04/2017 3:41 PM

## 2017-05-05 MED ORDER — MIRTAZAPINE 15 MG PO TABS: 15.0000 mg | ORAL_TABLET | Freq: Every day | ORAL | 0 refills | Status: AC

## 2017-05-05 MED ORDER — HYDROXYZINE HCL 25 MG PO TABS: 25.0000 mg | ORAL_TABLET | Freq: Three times a day (TID) | ORAL | 0 refills | Status: AC | PRN

## 2017-05-05 NOTE — [facility] Suicide Risk Assessment (Signed)
[facility]   Principal Problem: MDD (major depressive disorder), single episode, severe with psychosis [facility](HCC) Discharge Diagnoses:  Patient Active Problem List   Diagnosis Date Noted  . MDD (major depressive disorder), single episode, severe with psychosis (HCC) [F32.3] 04/29/2017    Total Time spent with patient: 30 minutes  Musculoskeletal: Strength & Muscle Tone: within normal limits Gait & Station: normal Patient leans: N/A  Psychiatric Specialty Exam: ROS no headache, no chest pain, no shortness of breath, no vomiting   Blood pressure (!) 141/84, pulse 95, temperature 98.4 F (36.9 C), temperature source Oral, resp. rate 16, height 5\' 8"  (1.727 m), weight 76.2 kg (168 lb), SpO2 100 %.Body mass index is 25.54 kg/m.  General Appearance: improved grooming   Eye Contact::  Good  Speech:  Normal Rate409  Volume:  Normal  Mood:  improved , denies depression, reports mood is " fine"  Affect:  more reactive, smiles at times appropriately   Thought Process:  Linear and Descriptions of Associations: Intact  Orientation:  Full (Time, Place, and Person)  Thought Content:  no hallucinations, no delusions, not internally preoccupied   Suicidal Thoughts:  No denies suicidal or self injurious ideations, no homicidal or violent ideations  Homicidal Thoughts:  No  Memory:  recent and remote grossly intact   Judgement:  Fair- improving   Insight:  Fair- improving   Psychomotor Activity:  Normal- no tremors or agitation  Concentration:  improved   Recall:  Good  Fund of Knowledge:Good  Language: Good  Akathisia:  Negative  Handed:  Right  AIMS (if indicated):     Assets:  Desire for Improvement Resilience Social Support  Sleep:  Number of Hours: 5.5  Cognition: WNL and Impaired,  Mild  ADL's:  Intact   Mental Status Per Nursing Assessment::   On Admission:  NA  Demographic Factors:  56 year old married male, lives with wife, currently unemployed    Loss Factors: Unemployment, report of cognitive decline  Historical Factors: History of alcohol use disorder  Risk Reduction Factors:   Living with another person, especially a relative, Positive social support and Positive coping skills or problem solving skills  Continued Clinical Symptoms:  At this time patient is alert, attentive, well groomed, improved eye contact, speech normal, mood described as normal, denies depression and presents euthymic, with a fuller range of affect, no thought disorder, no suicidal ideations, no homicidal ideations, no psychotic symptoms, future oriented. He is currently fully alert, attentive, and oriented . As noted MMSE score 24/30 yesterday. Denies medication side effects. Behavior on unit calm and in good control. Wife has corroborated that patient presents with improvement compared to prior to admission, and is in agreement with him being discharged today. She reports she has made firearms inaccessible to patient. Patient reports improving insight regarding negative impact of alcohol use disorder, and possible deleterious effect on cognitive status, states he does not intend to drink and plans to remain sober . Denies cravings.   Cognitive Features That Contribute To Risk:  Alert and attentive, oriented .     Suicide Risk:  Mild:  Suicidal ideation of limited frequency, intensity, duration, and specificity.  There are no identifiable plans, no associated intent, mild dysphoria and related symptoms, good self-control (both objective and subjective assessment), few other risk factors, and identifiable protective factors, including available and accessible social support.  Follow-up Information    [facility] Follow up on 05/31/2017.   Specialty:  Internal  Medicine Why:  at 1:00pm for your first appointment for primary care. You may call back after 9/4 to see if they have a sooner appointment. Contact information: [address] [place] [address] [phone]       [facility] Follow up on 05/09/2017.   Why:  at 9:45am for your hospital discharge appointment. Please bring a photo ID, insurance card if applicable, social security card, and proof of household income. Contact information: [address] [place] [address] [phone]           Plan Of Care/Follow-up recommendations:  Activity:  as tolerated  Diet:  Regular Tests:  NA Other:  See below Patient is leaving unit in good spirits Looking forward to returning home- states his son will pick him up later today He is planning to follow up as above We have reviewed importance of avoiding people, places and situations that may be associated with alcohol to decrease risk of relapse, have encouraged patient to consider AA attendance . I have reviewed with patient and with his wife via phone the importance of following up with PCP AND also with a Neurologist- they have expressed understanding .  Craige Cotta, MD 05/05/2017, 9:23 AM
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
## 2017-05-05 NOTE — Progress Notes (Signed)
Pt reports he has had a good day and is looking forward to being discharged soon, hopefully tomorrow.  He has been observed sitting in the dayroom watching TV.  He denies SI/HI/AVH at this time.  He denies having any withdrawal symptoms.  He has been pleasant and cooperative with staff.  Support and encouragement offered.  Discharge plans are in process.  Pt took his medication without incident.  Safety maintained with q15 minute checks.

## 2017-05-05 NOTE — Progress Notes (Addendum)
With pt's son, Selena BattenCody present and part of conversation, Clinical research associatewriter reviewed pt's dc teaching and instructions, gave pt cc of these forms ( AVS, SRA, transition record and SSP). Son Selena Batten( Cody) stated he understood and will comply and voiced understanding and willingness to comply. Pt given prescriptions for meds as well as 7 day sample of med. Pt completed daily assessment and on this he wrote he denied active SI today and did not rate his feelings  of anxiety, depression, and hopelessness . R Safety is in place. Patient and his brother accompanied to bldg entrance and pt dc'd

## 2017-05-05 NOTE — Progress Notes (Signed)
  [facility] :  Will you be returning to the same living situation after discharge:  Yes,  Pt returning home with wife At discharge, do you have transportation home?: Yes,  Pt wife to pick up Do you have the ability to pay for your medications: Yes,  Pt provided with samples and prescriptions  Release of information consent forms completed and in the chart;  Patient's signature needed at discharge.  Patient to Follow up at: Follow-up Information    [facility] Follow up on 05/31/2017.   Specialty:  Internal Medicine Why:  at 1:00pm for your first appointment for primary care. You may call back after 9/4 to see if they have a sooner appointment. Contact information: [address] [place] [id number] [phone]       [facility] Follow up on 05/09/2017.   Why:  at 9:45am for your hospital discharge appointment. Please bring a photo ID, insurance card if applicable, social security card, and proof of household income. Contact information: [address] [place] [address] [phone]           Next level of care provider [facility] Link:no  Safety Planning and Suicide Prevention discussed: Yes,  with wife; see SPE note  Have you used any form of tobacco in the last 30 days? (Cigarettes, Smokeless Tobacco, Cigars, and/or Pipes): No  Has patient been referred to the Quitline?: N/A patient is not a smoker  Patient has been referred for addiction treatment: Yes  Verdene LennertLauren C Tyshon Fanning, LCSW 05/05/2017, 8:26 AM

## 2017-05-05 NOTE — Progress Notes (Signed)
Recreation Therapy Notes  Date: 05/05/17 Time: 0930 Location: [address] Dayroom  Group Topic: Stress Management  Goal Area(s) Addresses:  Patient will verbalize importance of using healthy stress management.  Patient will identify positive emotions associated with healthy stress management.   Intervention: Stress Management  Activity :  Progressive Muscle Relaxation.  LRT introduced the stress management technique of progressive muscle relaxation.  Patients were to follow along as LRT read script to fully engage in the technique.  Education:  Stress Management, Discharge Planning.   Education Outcome: Acknowledges edcuation/In group clarification offered/Needs additional education  Clinical Observations/Feedback: Pt did not attend group.    Caroll RancherMarjette Harold Mattes, LRT/CTRS         Lillia AbedLindsay, Luken Shadowens A 05/05/2017 12:12 PM

## 2017-05-31 ENCOUNTER — Ambulatory Visit: Payer: Self-pay | Admitting: Family Medicine

## 2017-06-08 ENCOUNTER — Ambulatory Visit: Payer: Self-pay | Admitting: Family Medicine

## 2019-02-08 IMAGING — MR MR HEAD W/O CM
11 of 12 series · 42 of 48 positions shown · non-contrast
Comparison: None.

CLINICAL DATA: 56 y/o M; memory loss and cognitive decline. History
of ETOH abuse.

EXAM:
MRI HEAD WITHOUT CONTRAST
TECHNIQUE: Multiplanar, multiecho pulse sequences of the brain and surrounding
structures were obtained without intravenous contrast.

[Series 3: DWI · axial · 3.0mm · 0.90mm/px · z∈[-47,+111]mm · 11 of 108 slices shown (1 of 6)]
[im 1/108]
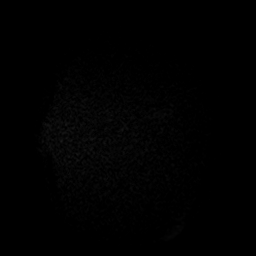
[im 11/108]
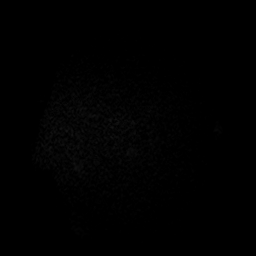
[im 22/108]
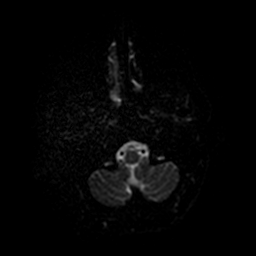
[im 33/108]
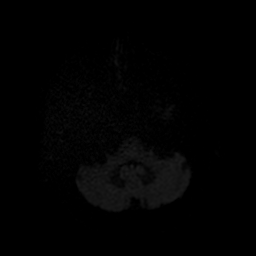
[im 43/108]
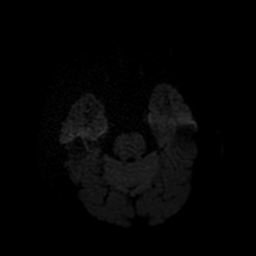
[im 54/108]
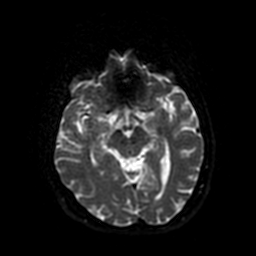
[im 65/108]
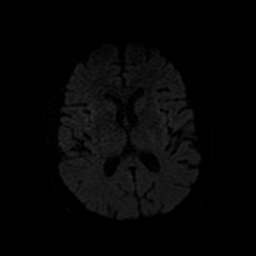
[im 75/108]
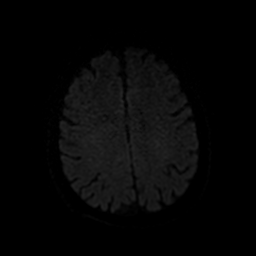
[im 86/108]
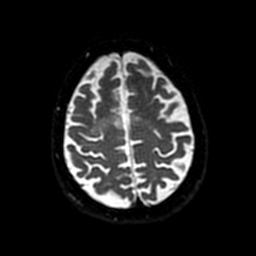
[im 97/108]
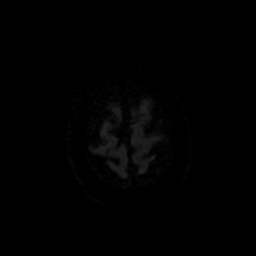
[im 108/108]
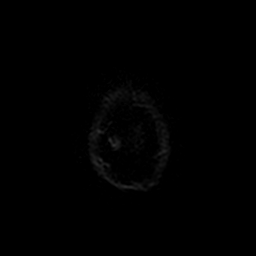

[Series 4: T1 · sagittal · 5.0mm · 0.47mm/px · 2 of 25 slices shown]
[im 1/25]
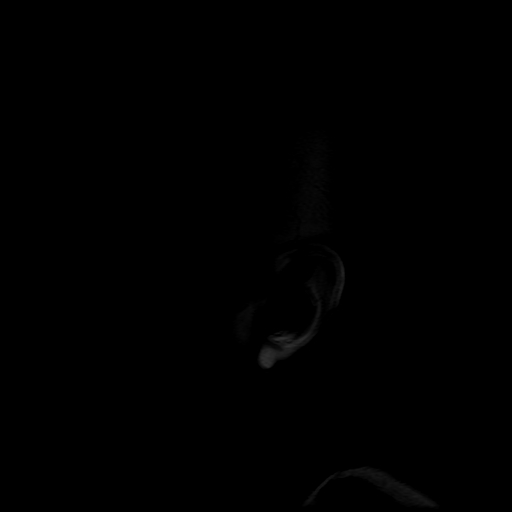
[im 25/25]
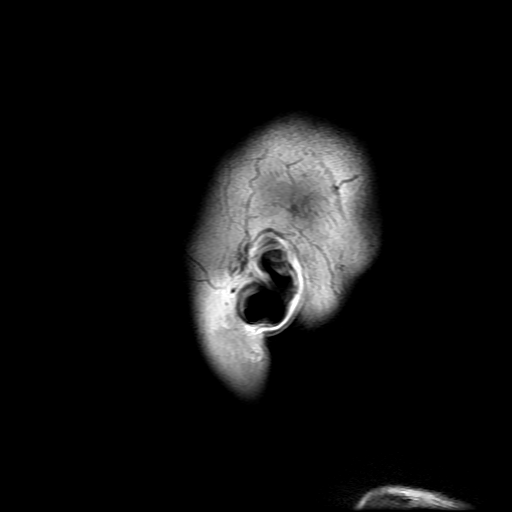

[Series 5: DWI · coronal · 5.0mm · 0.94mm/px · 6 of 68 slices shown (2 of 6)]
[im 1/68]
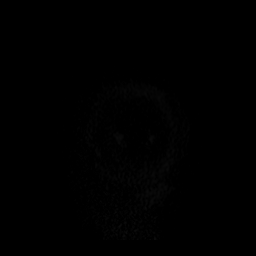
[im 14/68]
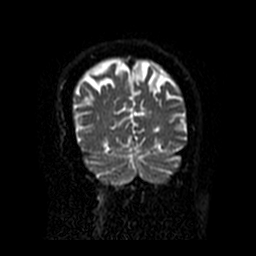
[im 27/68]
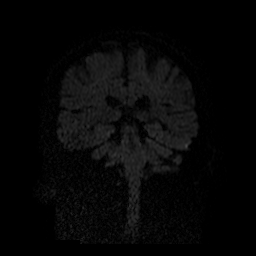
[im 41/68]
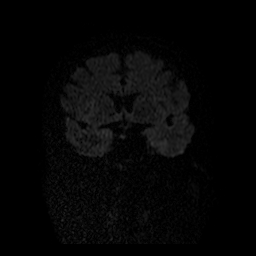
[im 54/68]
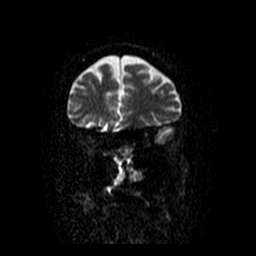
[im 68/68]
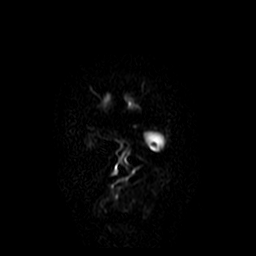

[Series 6: T2 · axial · 5.0mm · 0.43mm/px · z∈[-49,+105]mm · 2 of 23 slices shown (1 of 2)]
[im 1/23]
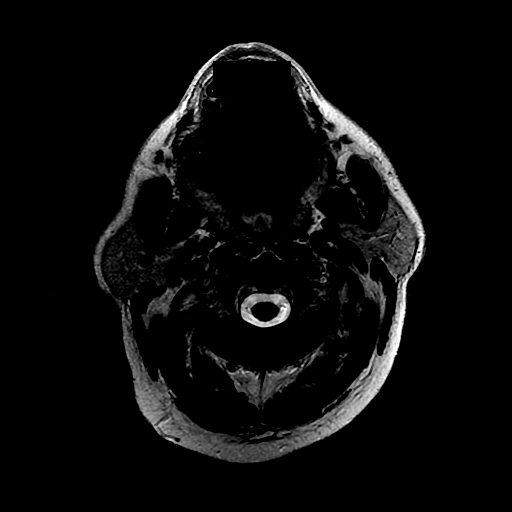
[im 23/23]
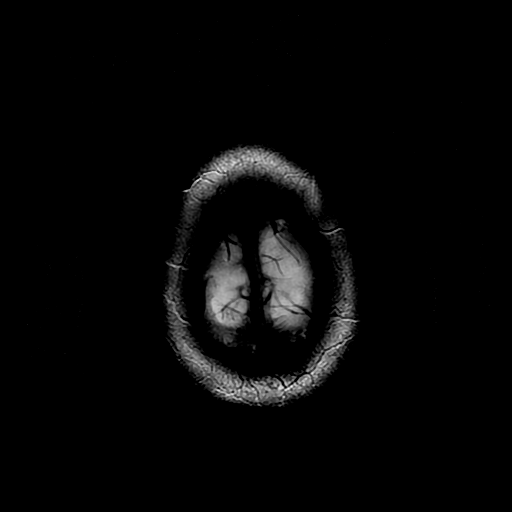

[Series 7: FLAIR · axial · 3.0mm · 0.43mm/px · z∈[-52,+110]mm · 2 of 28 slices shown]
[im 1/28]
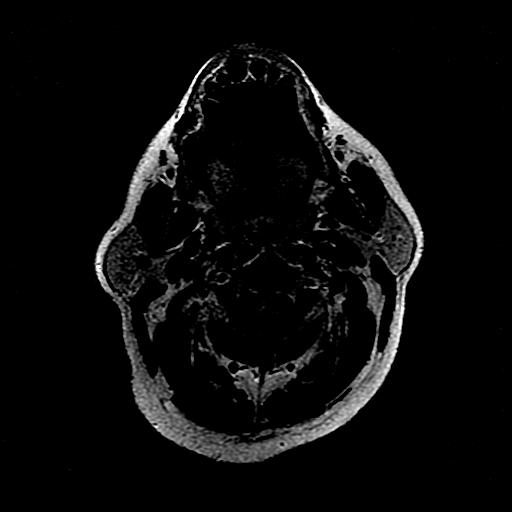
[im 28/28]
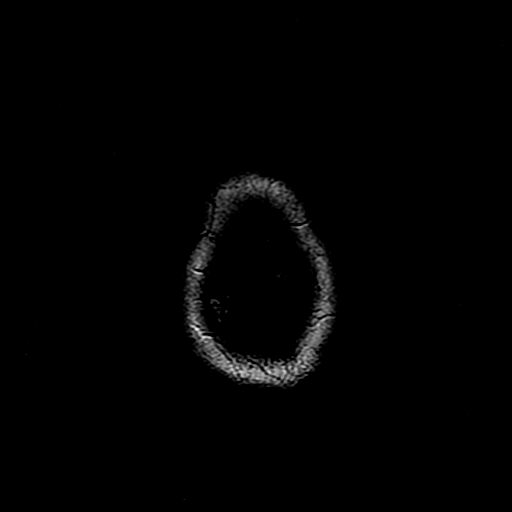

[Series 8: ax mpgr · axial · 5.0mm · 0.43mm/px · 1 of 23 slices shown]
[im 1/23]
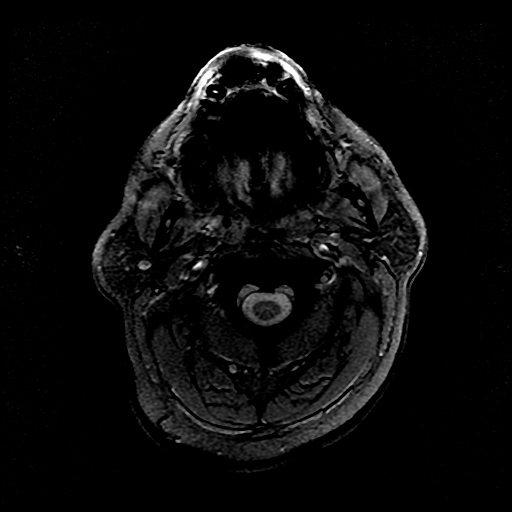

[Series 10: T2 · coronal · 5.0mm · 0.45mm/px · 2 of 27 slices shown (2 of 2)]
[im 1/27]
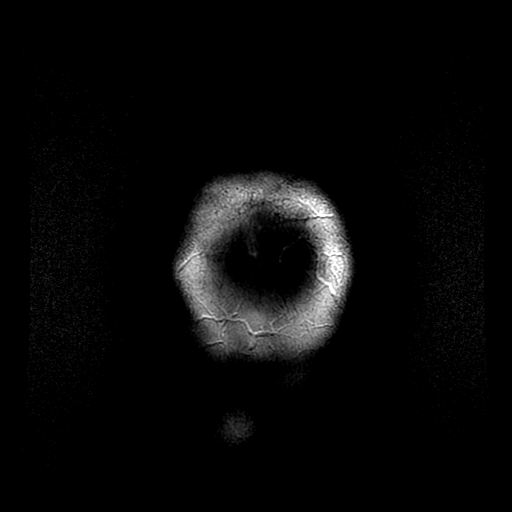
[im 27/27]
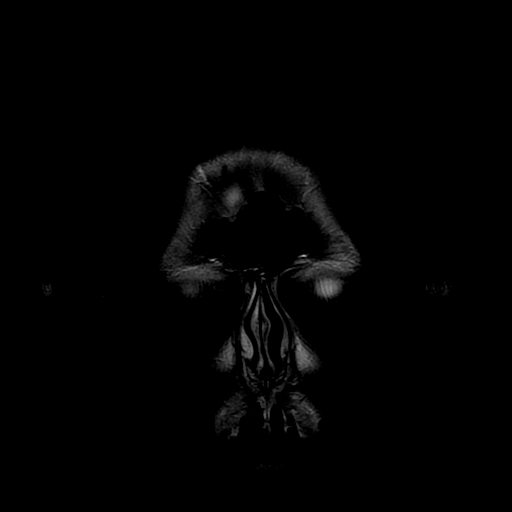

[Series 300: DWI · axial · 3.0mm · 0.90mm/px · z∈[-47,+111]mm · 5 of 54 slices shown (3 of 6)]
[im 1/54]
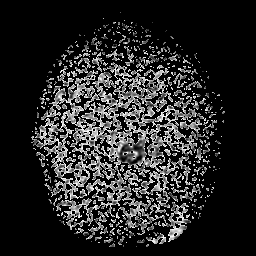
[im 14/54]
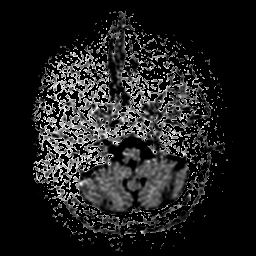
[im 27/54]
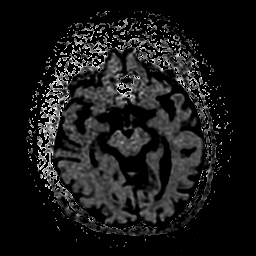
[im 40/54]
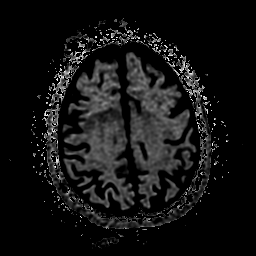
[im 54/54]
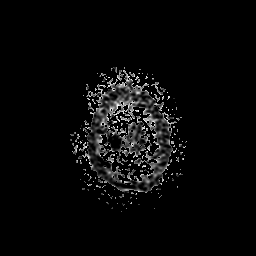

[Series 301: DWI · axial · 3.0mm · 0.90mm/px · z∈[-47,+111]mm · 5 of 54 slices shown (4 of 6)]
[im 1/54]
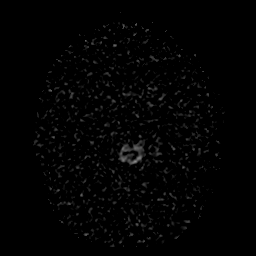
[im 14/54]
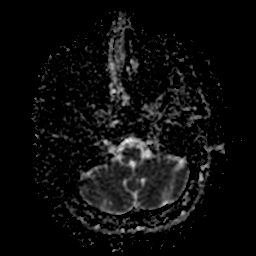
[im 27/54]
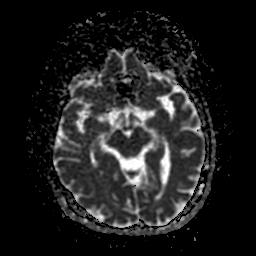
[im 40/54]
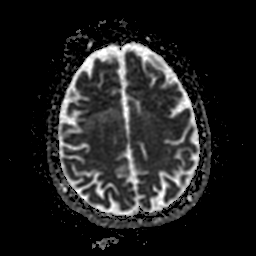
[im 54/54]
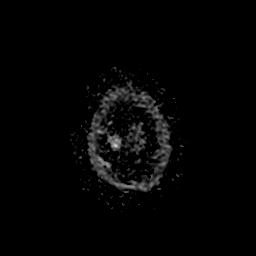

[Series 500: DWI · coronal · 5.0mm · 0.94mm/px · 3 of 34 slices shown (5 of 6)]
[im 1/34]
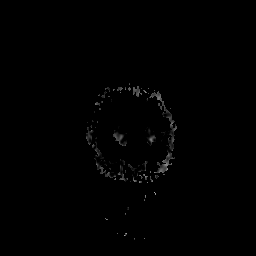
[im 17/34]
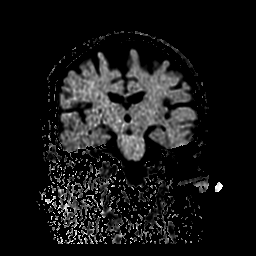
[im 34/34]
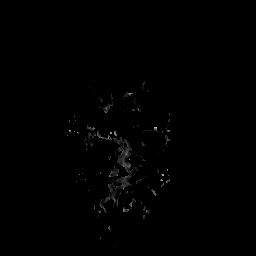

[Series 501: DWI · coronal · 5.0mm · 0.94mm/px · 3 of 34 slices shown (6 of 6)]
[im 1/34]
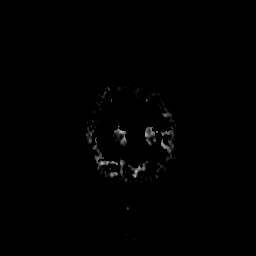
[im 17/34]
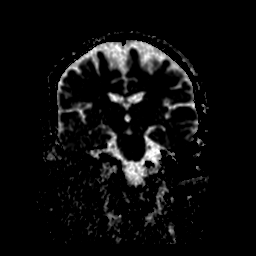
[im 34/34]
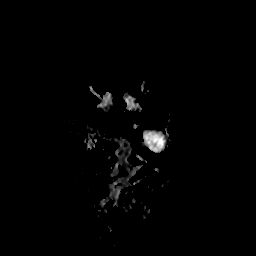

[42 of 48 positions shown; findings below may reference images not displayed]

FINDINGS: Brain: No acute infarction, hemorrhage, hydrocephalus, extra-axial
collection or mass lesion. Few nonspecific foci of T2 FLAIR
hyperintense signal abnormality in subcortical white matter are of
unlikely clinical significance. Mild diffuse brain parenchymal
volume loss.

Vascular: Normal flow voids.

Skull and upper cervical spine: Normal marrow signal.

Sinuses/Orbits: Mild ethmoid sinus mucosal thickening. Otherwise
negative.

Other: None.
IMPRESSION: 1. No acute intracranial abnormality.
2. Mild diffuse brain parenchymal volume loss.

By: Carlota Sharifi M.D.

## 2021-03-31 DIAGNOSIS — J069 Acute upper respiratory infection, unspecified: Secondary | ICD-10-CM | POA: Diagnosis not present

## 2021-03-31 DIAGNOSIS — U071 COVID-19: Secondary | ICD-10-CM | POA: Diagnosis not present

## 2021-03-31 DIAGNOSIS — R509 Fever, unspecified: Secondary | ICD-10-CM | POA: Diagnosis not present

## 2021-04-05 DIAGNOSIS — K7460 Unspecified cirrhosis of liver: Secondary | ICD-10-CM | POA: Diagnosis not present

## 2021-05-18 DIAGNOSIS — R1110 Vomiting, unspecified: Secondary | ICD-10-CM | POA: Diagnosis not present

## 2021-05-18 DIAGNOSIS — R5382 Chronic fatigue, unspecified: Secondary | ICD-10-CM | POA: Diagnosis not present

## 2021-05-18 DIAGNOSIS — R110 Nausea: Secondary | ICD-10-CM | POA: Diagnosis not present

## 2021-05-18 DIAGNOSIS — E860 Dehydration: Secondary | ICD-10-CM | POA: Diagnosis not present

## 2021-06-06 DIAGNOSIS — R112 Nausea with vomiting, unspecified: Secondary | ICD-10-CM | POA: Diagnosis not present

## 2021-06-06 DIAGNOSIS — Z79899 Other long term (current) drug therapy: Secondary | ICD-10-CM | POA: Diagnosis not present

## 2021-06-06 DIAGNOSIS — K529 Noninfective gastroenteritis and colitis, unspecified: Secondary | ICD-10-CM | POA: Diagnosis not present

## 2021-06-11 DIAGNOSIS — D518 Other vitamin B12 deficiency anemias: Secondary | ICD-10-CM | POA: Diagnosis not present

## 2021-06-11 DIAGNOSIS — E119 Type 2 diabetes mellitus without complications: Secondary | ICD-10-CM | POA: Diagnosis not present

## 2021-06-11 DIAGNOSIS — E782 Mixed hyperlipidemia: Secondary | ICD-10-CM | POA: Diagnosis not present

## 2021-06-11 DIAGNOSIS — E038 Other specified hypothyroidism: Secondary | ICD-10-CM | POA: Diagnosis not present

## 2021-06-11 DIAGNOSIS — E559 Vitamin D deficiency, unspecified: Secondary | ICD-10-CM | POA: Diagnosis not present

## 2021-06-11 DIAGNOSIS — I10 Essential (primary) hypertension: Secondary | ICD-10-CM | POA: Diagnosis not present

## 2021-07-12 DIAGNOSIS — D518 Other vitamin B12 deficiency anemias: Secondary | ICD-10-CM | POA: Diagnosis not present

## 2021-07-12 DIAGNOSIS — I10 Essential (primary) hypertension: Secondary | ICD-10-CM | POA: Diagnosis not present

## 2021-07-12 DIAGNOSIS — E119 Type 2 diabetes mellitus without complications: Secondary | ICD-10-CM | POA: Diagnosis not present

## 2021-07-12 DIAGNOSIS — E038 Other specified hypothyroidism: Secondary | ICD-10-CM | POA: Diagnosis not present

## 2021-07-12 DIAGNOSIS — E559 Vitamin D deficiency, unspecified: Secondary | ICD-10-CM | POA: Diagnosis not present

## 2021-07-12 DIAGNOSIS — E782 Mixed hyperlipidemia: Secondary | ICD-10-CM | POA: Diagnosis not present

## 2021-08-17 DIAGNOSIS — E119 Type 2 diabetes mellitus without complications: Secondary | ICD-10-CM | POA: Diagnosis not present

## 2021-08-17 DIAGNOSIS — E782 Mixed hyperlipidemia: Secondary | ICD-10-CM | POA: Diagnosis not present

## 2021-08-17 DIAGNOSIS — E038 Other specified hypothyroidism: Secondary | ICD-10-CM | POA: Diagnosis not present

## 2021-08-17 DIAGNOSIS — E559 Vitamin D deficiency, unspecified: Secondary | ICD-10-CM | POA: Diagnosis not present

## 2021-08-17 DIAGNOSIS — I10 Essential (primary) hypertension: Secondary | ICD-10-CM | POA: Diagnosis not present

## 2021-08-17 DIAGNOSIS — D518 Other vitamin B12 deficiency anemias: Secondary | ICD-10-CM | POA: Diagnosis not present

## 2021-09-20 DIAGNOSIS — E782 Mixed hyperlipidemia: Secondary | ICD-10-CM | POA: Diagnosis not present

## 2021-09-20 DIAGNOSIS — E119 Type 2 diabetes mellitus without complications: Secondary | ICD-10-CM | POA: Diagnosis not present

## 2021-09-20 DIAGNOSIS — E559 Vitamin D deficiency, unspecified: Secondary | ICD-10-CM | POA: Diagnosis not present

## 2021-09-20 DIAGNOSIS — I10 Essential (primary) hypertension: Secondary | ICD-10-CM | POA: Diagnosis not present

## 2021-09-20 DIAGNOSIS — D518 Other vitamin B12 deficiency anemias: Secondary | ICD-10-CM | POA: Diagnosis not present

## 2021-09-20 DIAGNOSIS — E038 Other specified hypothyroidism: Secondary | ICD-10-CM | POA: Diagnosis not present

## 2021-10-20 DIAGNOSIS — R1110 Vomiting, unspecified: Secondary | ICD-10-CM | POA: Diagnosis not present

## 2021-10-20 DIAGNOSIS — R410 Disorientation, unspecified: Secondary | ICD-10-CM | POA: Diagnosis not present

## 2021-10-20 DIAGNOSIS — R9431 Abnormal electrocardiogram [ECG] [EKG]: Secondary | ICD-10-CM | POA: Diagnosis not present

## 2021-10-20 DIAGNOSIS — E860 Dehydration: Secondary | ICD-10-CM | POA: Diagnosis not present

## 2021-10-20 DIAGNOSIS — R197 Diarrhea, unspecified: Secondary | ICD-10-CM | POA: Diagnosis not present

## 2021-10-20 DIAGNOSIS — Z79899 Other long term (current) drug therapy: Secondary | ICD-10-CM | POA: Diagnosis not present

## 2021-10-24 DIAGNOSIS — E860 Dehydration: Secondary | ICD-10-CM | POA: Diagnosis not present

## 2021-10-26 DIAGNOSIS — E559 Vitamin D deficiency, unspecified: Secondary | ICD-10-CM | POA: Diagnosis not present

## 2021-10-26 DIAGNOSIS — D518 Other vitamin B12 deficiency anemias: Secondary | ICD-10-CM | POA: Diagnosis not present

## 2021-10-26 DIAGNOSIS — I10 Essential (primary) hypertension: Secondary | ICD-10-CM | POA: Diagnosis not present

## 2021-10-26 DIAGNOSIS — E782 Mixed hyperlipidemia: Secondary | ICD-10-CM | POA: Diagnosis not present

## 2021-10-26 DIAGNOSIS — E038 Other specified hypothyroidism: Secondary | ICD-10-CM | POA: Diagnosis not present

## 2021-10-26 DIAGNOSIS — E119 Type 2 diabetes mellitus without complications: Secondary | ICD-10-CM | POA: Diagnosis not present

## 2021-10-27 DIAGNOSIS — R300 Dysuria: Secondary | ICD-10-CM | POA: Diagnosis not present

## 2021-10-27 DIAGNOSIS — Z0000 Encounter for general adult medical examination without abnormal findings: Secondary | ICD-10-CM | POA: Diagnosis not present

## 2021-10-27 DIAGNOSIS — E782 Mixed hyperlipidemia: Secondary | ICD-10-CM | POA: Diagnosis not present

## 2021-10-27 DIAGNOSIS — G4700 Insomnia, unspecified: Secondary | ICD-10-CM | POA: Diagnosis not present

## 2021-10-27 DIAGNOSIS — E038 Other specified hypothyroidism: Secondary | ICD-10-CM | POA: Diagnosis not present

## 2021-10-27 DIAGNOSIS — R1110 Vomiting, unspecified: Secondary | ICD-10-CM | POA: Diagnosis not present

## 2021-10-27 DIAGNOSIS — E119 Type 2 diabetes mellitus without complications: Secondary | ICD-10-CM | POA: Diagnosis not present

## 2021-10-27 DIAGNOSIS — D518 Other vitamin B12 deficiency anemias: Secondary | ICD-10-CM | POA: Diagnosis not present

## 2021-10-27 DIAGNOSIS — I10 Essential (primary) hypertension: Secondary | ICD-10-CM | POA: Diagnosis not present

## 2021-10-27 DIAGNOSIS — E559 Vitamin D deficiency, unspecified: Secondary | ICD-10-CM | POA: Diagnosis not present

## 2021-11-26 DIAGNOSIS — D518 Other vitamin B12 deficiency anemias: Secondary | ICD-10-CM | POA: Diagnosis not present

## 2021-11-26 DIAGNOSIS — E119 Type 2 diabetes mellitus without complications: Secondary | ICD-10-CM | POA: Diagnosis not present

## 2021-11-26 DIAGNOSIS — E038 Other specified hypothyroidism: Secondary | ICD-10-CM | POA: Diagnosis not present

## 2021-11-26 DIAGNOSIS — E782 Mixed hyperlipidemia: Secondary | ICD-10-CM | POA: Diagnosis not present

## 2021-11-26 DIAGNOSIS — E559 Vitamin D deficiency, unspecified: Secondary | ICD-10-CM | POA: Diagnosis not present

## 2021-11-26 DIAGNOSIS — I10 Essential (primary) hypertension: Secondary | ICD-10-CM | POA: Diagnosis not present

## 2021-12-15 DIAGNOSIS — D518 Other vitamin B12 deficiency anemias: Secondary | ICD-10-CM | POA: Diagnosis not present

## 2021-12-15 DIAGNOSIS — E559 Vitamin D deficiency, unspecified: Secondary | ICD-10-CM | POA: Diagnosis not present

## 2021-12-15 DIAGNOSIS — I10 Essential (primary) hypertension: Secondary | ICD-10-CM | POA: Diagnosis not present

## 2021-12-15 DIAGNOSIS — E782 Mixed hyperlipidemia: Secondary | ICD-10-CM | POA: Diagnosis not present

## 2021-12-15 DIAGNOSIS — E119 Type 2 diabetes mellitus without complications: Secondary | ICD-10-CM | POA: Diagnosis not present

## 2021-12-15 DIAGNOSIS — E038 Other specified hypothyroidism: Secondary | ICD-10-CM | POA: Diagnosis not present

## 2022-10-06 DEATH — deceased
